# Patient Record
Sex: Male | Born: 1955 | Race: Black or African American | Hispanic: No | Marital: Married | Smoking: Never smoker
Health system: Southern US, Community
[De-identification: ages and names within clinical notes are randomized; demographics above are authoritative.]

## PROBLEM LIST (undated history)

## (undated) DIAGNOSIS — I639 Cerebral infarction, unspecified: Secondary | ICD-10-CM

## (undated) DIAGNOSIS — J45909 Unspecified asthma, uncomplicated: Secondary | ICD-10-CM

## (undated) HISTORY — PX: ACHILLES TENDON SURGERY: SHX542

---

## 2014-04-13 ENCOUNTER — Emergency Department (HOSPITAL_COMMUNITY)
Admission: EM | Admit: 2014-04-13 | Discharge: 2014-04-13 | Disposition: A | Discharge status: Home / Self Care | Payer: Self-pay

## 2014-04-13 ENCOUNTER — Encounter (HOSPITAL_COMMUNITY): Payer: Self-pay | Admitting: Emergency Medicine

## 2014-04-13 HISTORY — DX: Unspecified asthma, uncomplicated: J45.909

## 2014-04-13 NOTE — ED Notes (Signed)
Per EMS: pt stepped on rake and went through shoe into left foot/heel.

## 2018-01-31 ENCOUNTER — Encounter (HOSPITAL_COMMUNITY): Payer: Self-pay

## 2018-01-31 ENCOUNTER — Other Ambulatory Visit: Payer: Self-pay

## 2018-01-31 ENCOUNTER — Emergency Department (HOSPITAL_COMMUNITY): Payer: Managed Care, Other (non HMO)

## 2018-01-31 ENCOUNTER — Inpatient Hospital Stay (HOSPITAL_COMMUNITY)
Admission: EM | Admit: 2018-01-31 | Discharge: 2018-02-02 | DRG: 066 | Disposition: A | Discharge status: Home / Self Care | Payer: Managed Care, Other (non HMO) | Attending: Internal Medicine | Admitting: Internal Medicine

## 2018-01-31 DIAGNOSIS — Z91041 Radiographic dye allergy status: Secondary | ICD-10-CM

## 2018-01-31 DIAGNOSIS — I6340 Cerebral infarction due to embolism of unspecified cerebral artery: Secondary | ICD-10-CM

## 2018-01-31 DIAGNOSIS — Z79899 Other long term (current) drug therapy: Secondary | ICD-10-CM

## 2018-01-31 DIAGNOSIS — E785 Hyperlipidemia, unspecified: Secondary | ICD-10-CM | POA: Diagnosis present

## 2018-01-31 DIAGNOSIS — J4520 Mild intermittent asthma, uncomplicated: Secondary | ICD-10-CM

## 2018-01-31 DIAGNOSIS — R2990 Unspecified symptoms and signs involving the nervous system: Secondary | ICD-10-CM | POA: Diagnosis present

## 2018-01-31 DIAGNOSIS — I639 Cerebral infarction, unspecified: Principal | ICD-10-CM | POA: Diagnosis present

## 2018-01-31 DIAGNOSIS — Z880 Allergy status to penicillin: Secondary | ICD-10-CM

## 2018-01-31 DIAGNOSIS — R030 Elevated blood-pressure reading, without diagnosis of hypertension: Secondary | ICD-10-CM | POA: Diagnosis present

## 2018-01-31 DIAGNOSIS — Z7951 Long term (current) use of inhaled steroids: Secondary | ICD-10-CM

## 2018-01-31 DIAGNOSIS — R270 Ataxia, unspecified: Secondary | ICD-10-CM | POA: Diagnosis not present

## 2018-01-31 DIAGNOSIS — Z823 Family history of stroke: Secondary | ICD-10-CM

## 2018-01-31 DIAGNOSIS — I10 Essential (primary) hypertension: Secondary | ICD-10-CM | POA: Diagnosis present

## 2018-01-31 DIAGNOSIS — J45909 Unspecified asthma, uncomplicated: Secondary | ICD-10-CM | POA: Diagnosis present

## 2018-01-31 HISTORY — DX: Cerebral infarction, unspecified: I63.9

## 2018-01-31 LAB — COMPREHENSIVE METABOLIC PANEL
ALBUMIN: 4.4 g/dL (ref 3.5–5.0)
ALK PHOS: 75 U/L (ref 38–126)
ALT: 36 U/L (ref 0–44)
AST: 24 U/L (ref 15–41)
Anion gap: 9 (ref 5–15)
BILIRUBIN TOTAL: 0.7 mg/dL (ref 0.3–1.2)
BUN: 9 mg/dL (ref 8–23)
CALCIUM: 9.2 mg/dL (ref 8.9–10.3)
CO2: 23 mmol/L (ref 22–32)
CREATININE: 1.05 mg/dL (ref 0.61–1.24)
Chloride: 105 mmol/L (ref 98–111)
GFR calc Af Amer: >60 mL/min (ref >60)
GLUCOSE: 105 mg/dL — AB (ref 70–99)
POTASSIUM: 3.5 mmol/L (ref 3.5–5.1)
Sodium: 137 mmol/L (ref 135–145)
TOTAL PROTEIN: 7.3 g/dL (ref 6.5–8.1)

## 2018-01-31 LAB — DIFFERENTIAL
ABS IMMATURE GRANULOCYTES: 0 10*3/uL (ref 0.0–0.1)
Basophils Absolute: 0 10*3/uL (ref 0.0–0.1)
Basophils Relative: 1 %
EOS ABS: 0.1 10*3/uL (ref 0.0–0.7)
Eosinophils Relative: 1 %
IMMATURE GRANULOCYTES: 0 %
LYMPHS ABS: 1.9 10*3/uL (ref 0.7–4.0)
LYMPHS PCT: 30 %
MONOS PCT: 10 %
Monocytes Absolute: 0.6 10*3/uL (ref 0.1–1.0)
Neutro Abs: 3.6 10*3/uL (ref 1.7–7.7)
Neutrophils Relative %: 58 %

## 2018-01-31 LAB — I-STAT CHEM 8, ED
BUN: 11 mg/dL (ref 8–23)
CHLORIDE: 105 mmol/L (ref 98–111)
CREATININE: 0.9 mg/dL (ref 0.61–1.24)
Calcium, Ion: 1.16 mmol/L (ref 1.15–1.40)
GLUCOSE: 108 mg/dL — AB (ref 70–99)
HEMATOCRIT: 46 % (ref 39.0–52.0)
HEMOGLOBIN: 15.6 g/dL (ref 13.0–17.0)
POTASSIUM: 3.6 mmol/L (ref 3.5–5.1)
Sodium: 140 mmol/L (ref 135–145)
TCO2: 21 mmol/L — ABNORMAL LOW (ref 22–32)

## 2018-01-31 LAB — CBC
HEMATOCRIT: 46.9 % (ref 39.0–52.0)
Hemoglobin: 15.6 g/dL (ref 13.0–17.0)
MCH: 31.4 pg (ref 26.0–34.0)
MCHC: 33.3 g/dL (ref 30.0–36.0)
MCV: 94.4 fL (ref 78.0–100.0)
PLATELETS: 215 10*3/uL (ref 150–400)
RBC: 4.97 MIL/uL (ref 4.22–5.81)
RDW: 13.2 % (ref 11.5–15.5)
WBC: 6.3 10*3/uL (ref 4.0–10.5)

## 2018-01-31 LAB — PROTIME-INR
INR: 1.05
Prothrombin Time: 13.6 seconds (ref 11.4–15.2)

## 2018-01-31 LAB — CBG MONITORING, ED: Glucose-Capillary: 95 mg/dL (ref 70–99)

## 2018-01-31 LAB — APTT: aPTT: 32 seconds (ref 24–36)

## 2018-01-31 LAB — I-STAT TROPONIN, ED: TROPONIN I, POC: 0 ng/mL (ref 0.00–0.08)

## 2018-01-31 MED ORDER — SODIUM CHLORIDE 0.9 % IV BOLUS
500.0000 mL | Freq: Once | INTRAVENOUS | Status: AC
  Administered 2018-01-31: 500 mL via INTRAVENOUS

## 2018-01-31 MED ORDER — MECLIZINE HCL 25 MG PO TABS
25.0000 mg | ORAL_TABLET | Freq: Once | ORAL | Status: AC
  Administered 2018-01-31: 25 mg via ORAL
  Filled 2018-01-31: qty 1

## 2018-01-31 NOTE — H&P (Signed)
History and Physical    Wayne Richards NFA:[id number] DOB: 01/30/1956 DOA: 01/31/2018  Referring MD/NP/PA:   PCP: Patient, No Pcp Per   Patient coming from:  The patient is coming from home.  At baseline, pt is independent for most of ADL.   Chief Complaint: ataxia and strokelike symptoms  HPI: Wayne Richards is a 62 y.o. male with medical history significant of asthma, borderline hypertension, vertigo, who presents with ataxia strokelike symptoms.  Patient states that he had one episode of left hand numbness and tingling 01/25/2018, lasted about 15 minutes. He also had 3 episodes of dizziness on the same day difficulty walking. Since then, pt has been having intermittent dizziness, difficulty walking.The symptoms have worsened today, and become more persistent for the whole day.  Currently patient does not have unilateral weakness, numbness or tingling in extremities.  No vision change or hearing loss.  Patient denies any chest pain, shortness breath, cough, fever or chills.  No nausea, vomiting, diarrhea, abdominal pain, symptoms of UTI.  Patient states that he has chronic vertigo, but symptoms are usually short lasting for about 3 to 5 minutes, and have never been persistent.   ED Course: pt was found to have WBC 6.3, INR 1.05, electrolytes renal function okay, temperature normal, no tachycardia, oxygen saturation 96% on room air, elevated blood pressure 177/112, CT head is negative for acute intracranial abnormalities.  Patient is placed on telemetry bed for observation.  Neurology, Dr. Laurence Slate was consulted.  Review of Systems:   General: no fevers, chills, no body weight gain, has fatigue HEENT: no blurry vision, hearing changes or sore throat Respiratory: no dyspnea, coughing, wheezing CV: no chest pain, no palpitations GI: no nausea, vomiting, abdominal pain, diarrhea, constipation GU: no dysuria, burning on urination, increased urinary frequency, hematuria  Ext: no leg edema Neuro: has  left hand tingling, ataxia. No vision change or hearing loss Skin: no rash, no skin tear. MSK: No muscle spasm, no deformity, no limitation of range of movement in spin Heme: No easy bruising.  Travel history: No recent long distant travel.  Allergy:  Allergies  Allergen Reactions  . Iodine Hives, Itching, Swelling and Rash  . Peanut-Containing Drug Products Hives, Itching and Swelling  . Shellfish Allergy Hives, Itching and Swelling  . Penicillins Hives, Swelling and Rash    Past Medical History:  Diagnosis Date  . Asthma     Past Surgical History:  Procedure Laterality Date  . ACHILLES TENDON SURGERY      Social History:  reports that he has never smoked. He does not have any smokeless tobacco history on file. He reports that he does not drink alcohol or use drugs.  Family History:  Family History  Problem Relation Age of Onset  . Hypertension Father   . Cardiomyopathy Brother      Prior to Admission medications   Medication Sig Start Date End Date Taking? Authorizing Provider  fexofenadine-pseudoephedrine (ALLEGRA-D) 60-120 MG 12 hr tablet Take 1 tablet by mouth 2 (two) times daily as needed (allergies).   Yes [provider]  Ibuprofen-Famotidine (DUEXIS) 800-26.6 MG TABS Take 1 tablet by mouth every 12 (twelve) hours as needed (pain).   Yes [provider]  salmeterol (SEREVENT DISKUS) 50 MCG/DOSE diskus inhaler Inhale 1 puff into the lungs 2 (two) times daily.   Yes [provider]    Physical Exam: Vitals:   01/31/18 2159 01/31/18 2200 01/31/18 2345 02/01/18 0000  BP:  (!) 169/92  (!) 157/94  Pulse:  66 65 67  Resp:  12 14 11   Temp: 98 F (36.7 C)     TempSrc:      SpO2:  98% 96% 95%   General: Not in acute distress HEENT:       Eyes: PERRL, EOMI, no scleral icterus.       ENT: No discharge from the ears and nose, no pharynx injection, no tonsillar enlargement.        Neck: No JVD, no bruit, no mass felt. Heme: No neck lymph  node enlargement. Cardiac: S1/S2, RRR, No murmurs, No gallops or rubs. Respiratory: No rales, wheezing, rhonchi or rubs. GI: Soft, nondistended, nontender, no rebound pain, no organomegaly, BS present. GU: No hematuria Ext: No pitting leg edema bilaterally. 2+DP/PT pulse bilaterally. Musculoskeletal: No joint deformities, No joint redness or warmth, no limitation of ROM in spin. Skin: No rashes.  Neuro: Alert, oriented X3, cranial nerves II-XII grossly intact, moves all extremities normally. Muscle strength 5/5 in all extremities, sensation to light touch intact. Brachial reflex 2+ bilaterally. Has ataxia. Psych: Patient is not psychotic, no suicidal or hemocidal ideation.  Labs on Admission: I have personally reviewed following labs and imaging studies  CBC: Recent Labs  Lab 01/31/18 1440 01/31/18 1452  WBC 6.3  --   NEUTROABS 3.6  --   HGB 15.6 15.6  HCT 46.9 46.0  MCV 94.4  --   PLT 215  --    Basic Metabolic Panel: Recent Labs  Lab 01/31/18 1440 01/31/18 1452  NA 137 140  K 3.5 3.6  CL 105 105  CO2 23  --   GLUCOSE 105* 108*  BUN 9 11  CREATININE 1.05 0.90  CALCIUM 9.2  --    GFR: CrCl cannot be calculated (Unknown ideal weight.). Liver Function Tests: Recent Labs  Lab 01/31/18 1440  AST 24  ALT 36  ALKPHOS 75  BILITOT 0.7  PROT 7.3  ALBUMIN 4.4   No results for input(s): LIPASE, AMYLASE in the last 168 hours. No results for input(s): AMMONIA in the last 168 hours. Coagulation Profile: Recent Labs  Lab 01/31/18 1440  INR 1.05   Cardiac Enzymes: No results for input(s): CKTOTAL, CKMB, CKMBINDEX, TROPONINI in the last 168 hours. BNP (last 3 results) No results for input(s): PROBNP in the last 8760 hours. HbA1C: No results for input(s): HGBA1C in the last 72 hours. CBG: Recent Labs  Lab 01/31/18 2109  GLUCAP 95   Lipid Profile: No results for input(s): CHOL, HDL, LDLCALC, TRIG, CHOLHDL, LDLDIRECT in the last 72 hours. Thyroid Function  Tests: No results for input(s): TSH, T4TOTAL, FREET4, T3FREE, THYROIDAB in the last 72 hours. Anemia Panel: No results for input(s): VITAMINB12, FOLATE, FERRITIN, TIBC, IRON, RETICCTPCT in the last 72 hours. Urine analysis: No results found for: COLORURINE, APPEARANCEUR, LABSPEC, PHURINE, GLUCOSEU, HGBUR, BILIRUBINUR, KETONESUR, PROTEINUR, UROBILINOGEN, NITRITE, LEUKOCYTESUR Sepsis Labs: @LABRCNTIP (procalcitonin:4,lacticidven:4) )No results found for this or any previous visit (from the past 240 hour(s)).   Radiological Exams on Admission: Ct Head Wo Contrast  Result Date: 01/31/2018 CLINICAL DATA:  One-week history of intermittent dizziness. Mild headache. No reported injury. EXAM: CT HEAD WITHOUT CONTRAST TECHNIQUE: Contiguous axial images were obtained from the base of the skull through the vertex without intravenous contrast. COMPARISON:  None. FINDINGS: Brain: No evidence of parenchymal hemorrhage or extra-axial fluid collection. No mass lesion, mass effect, or midline shift. No CT evidence of acute infarction. Cerebral volume is age appropriate. No ventriculomegaly. Vascular: No acute abnormality. Skull: No evidence of calvarial  fracture. Sinuses/Orbits: No fluid levels. Scattered mild partial opacification of the dependent maxillary sinuses and ethmoidal air cells. Other:  The mastoid air cells are unopacified. IMPRESSION: 1.  No evidence of acute intracranial abnormality. 2. Mild paranasal sinusitis of uncertain chronicity, probably chronic. Electronically Signed   By: Delbert Phenix M.D.   On: 01/31/2018 16:51     EKG: Independently reviewed.  Sinus rhythm, QTC 407, Q waves in lead III, mild T wave inversion in V4-V6  Assessment/Plan Principal Problem:   Ataxia Active Problems:   Asthma   Elevated blood pressure reading   Stroke-like symptoms   Ataxia and stroke-like symptoms: Etiology is not clear.  Differential diagnosis include posterior circulation stroke and peripheral vertigo.   CT head is negative for acute intracranial abnormalities. I will get MRI first. If pt has stroke-->will do stroke work up. Neurology, Dr. Lyman Bishop was consulted.  - will place on tele bed for obs - will follow up Neurology's Recs.  - Obtain MRI - start ASA  - fasting lipid panel and HbA1c  - Check UDS  - PT/OT consult  Asthma: stable -prn albuterol nebs -Brovana nebs  Elevated blood pressure reading: pt has hx o borderline hypertension, not on medications. BNp 177/112. -prn IV hydralazine for blood pressure>220. If MRI is negative for stroke and Bp is persistently elevated, will start oral blood pressure medications, such as amlodipine   DVT ppx: SQ Lovenox Code Status: Full code Family Communication: None at bed side.       Disposition Plan:  Anticipate discharge back to previous home environment Consults called:  Neurology, Dr. Verita Lamb Admission status: Obs / tele    Date of Service 02/01/2018    Lorretta Harp [facility] Pager [phone]  If 7PM-7AM, please contact night-coverage [email or web address] Password [email or web address] 02/01/2018, 1:34 AM

## 2018-01-31 NOTE — ED Triage Notes (Signed)
Pt endorses confusion and difficulty with coordination while ambulating since 01/25/18 that was intermittent until last night. Has remained constant since. VSS, axox4. NO neuro deficits.

## 2018-01-31 NOTE — ED Provider Notes (Signed)
Emergency Department Provider Note   I have reviewed the triage vital signs and the nursing notes.   HISTORY  Chief Complaint Altered Mental Status   HPI Wayne Richards is a 62 y.o. male with PMH of asthma and borderline HTN presents to the emergency department for evaluation of intermittent feeling of being off balance and more persistent gait instability over the past 24 hours.  Patient states he has a history of vertigo but this feels very unlike his vertigo symptoms.  He states with his typical vertigo he has sudden, severe sensation of room spinning and nausea.  He states this is more like feeling generally off balance and uncoordinated.  His episodes over the past 2 weeks have lasted approximately 1 hour and then resolved.  Starting yesterday evening at approximately 10 PM he developed this off balance station and has persisted until today.  He denies any chest pain or dyspnea.  He is also feeling some left arm weakness with some tingling in the fingers on that side.  No left leg symptoms.  No voice changes, vision changes, difficulty swallowing.    Past Medical History:  Diagnosis Date  . Asthma     Patient Active Problem List   Diagnosis Date Noted  . Cerebral embolism with cerebral infarction 02/01/2018  . 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 Stroke-like symptoms   . Ataxia 01/31/2018  . Elevated blood pressure reading 01/31/2018  . Asthma     Past Surgical History:  Procedure Laterality Date  . ACHILLES TENDON SURGERY      Allergies Iodine; Peanut-containing drug products; Shellfish allergy; and Penicillins  Family History  Problem Relation Age of Onset  . Hypertension Father   . Cardiomyopathy Brother     Social History Social History   Tobacco Use  . Smoking status: Never Smoker  Substance Use Topics  . Alcohol use: Never    Frequency: Never  . Drug use: Never    Review of Systems  Constitutional: No fever/chills Eyes: No visual  changes. ENT: No sore throat. Cardiovascular: Denies chest pain. Respiratory: Denies shortness of breath. Gastrointestinal: No abdominal pain.  No nausea, no vomiting.  No diarrhea.  No constipation. Genitourinary: Negative for dysuria. Musculoskeletal: Negative for back pain. Skin: Negative for rash. Neurological: Negative for headaches. Positive gait instability.   10-point ROS otherwise negative.  ____________________________________________   PHYSICAL EXAM:  VITAL SIGNS: ED Triage Vitals  Enc Vitals Group     BP 01/31/18 1429 (!) 165/98     Pulse Rate 01/31/18 1429 62     Resp 01/31/18 1429 20     Temp 01/31/18 1429 (!) 97.5 F (36.4 C)     Temp Source 01/31/18 1429 Oral     SpO2 01/31/18 1429 98 %     Pain Score 01/31/18 1441 0   Constitutional: Alert and oriented. Well appearing and in no acute distress. Eyes: Conjunctivae are normal. PERRL. EOMI. Head: Atraumatic. Nose: No congestion/rhinnorhea. Mouth/Throat: Mucous membranes are moist.  Neck: No stridor. Cardiovascular: Normal rate, regular rhythm. Good peripheral circulation. Grossly normal heart sounds.   Respiratory: Normal respiratory effort.  No retractions. Lungs CTAB. Gastrointestinal: Soft and nontender. No distention.  Musculoskeletal: No lower extremity tenderness nor edema. No gross deformities of extremities. Neurologic:  Normal speech and language. Normal CN exam 2-12. Normal finger-to-nose with the right hand but slightly ataxic with the left. Very mild decreased grip strength on the left. Normal sensation in the upper and lower extremities. Normal heel-to-shin bilaterally. Some truncal  ataxia on standing from bed and slightly hesitant ataxic gait but does not require assistance with ambulation.  Skin:  Skin is warm, dry and intact. No rash noted.   ____________________________________________   LABS (all labs ordered are listed, but only abnormal results are displayed)  Labs Reviewed   COMPREHENSIVE METABOLIC PANEL - Abnormal; Notable for the following components:      Result Value   Glucose, Bld 105 (*)    All other components within normal limits  RAPID URINE DRUG SCREEN, HOSP PERFORMED - Abnormal; Notable for the following components:   Barbiturates   (*)    Value: Result not available. Reagent lot number recalled by manufacturer.   All other components within normal limits  HEMOGLOBIN A1C - Abnormal; Notable for the following components:   Hgb A1c MFr Bld 5.8 (*)    All other components within normal limits  LIPID PANEL - Abnormal; Notable for the following components:   Cholesterol 232 (*)    Triglycerides 233 (*)    HDL 35 (*)    VLDL 47 (*)    LDL Cholesterol 150 (*)    All other components within normal limits  I-STAT CHEM 8, ED - Abnormal; Notable for the following components:   Glucose, Bld 108 (*)    TCO2 21 (*)    All other components within normal limits  PROTIME-INR  APTT  CBC  DIFFERENTIAL  HIV ANTIBODY (ROUTINE TESTING)  I-STAT TROPONIN, ED  CBG MONITORING, ED   ____________________________________________  EKG   EKG Interpretation  Date/Time:  Monday January 31 2018 14:34:59 EDT Ventricular Rate:  67 PR Interval:  148 QRS Duration: 94 QT Interval:  386 QTC Calculation: 407 R Axis:   41 Text Interpretation:  Normal sinus rhythm with sinus arrhythmia Nonspecific T wave abnormality Abnormal ECG No STEMI.  Confirmed by Alona Bene [phone]) on 01/31/2018 11:24:50 PM       ____________________________________________  RADIOLOGY  Ct Head Wo Contrast  Result Date: 01/31/2018 CLINICAL DATA:  One-week history of intermittent dizziness. Mild headache. No reported injury. EXAM: CT HEAD WITHOUT CONTRAST TECHNIQUE: Contiguous axial images were obtained from the base of the skull through the vertex without intravenous contrast. COMPARISON:  None. FINDINGS: Brain: No evidence of parenchymal hemorrhage or extra-axial fluid collection. No mass  lesion, mass effect, or midline shift. No CT evidence of acute infarction. Cerebral volume is age appropriate. No ventriculomegaly. Vascular: No acute abnormality. Skull: No evidence of calvarial fracture. Sinuses/Orbits: No fluid levels. Scattered mild partial opacification of the dependent maxillary sinuses and ethmoidal air cells. Other:  The mastoid air cells are unopacified. IMPRESSION: 1.  No evidence of acute intracranial abnormality. 2. Mild paranasal sinusitis of uncertain chronicity, probably chronic. Electronically Signed   By: Delbert Phenix M.D.   On: 01/31/2018 16:51   Mr Brain Wo Contrast  Result Date: 02/01/2018 CLINICAL DATA:  Acute presentation with ataxia and stroke-like symptoms. Episode of left hand numbness and tingling. EXAM: MRI HEAD WITHOUT CONTRAST TECHNIQUE: Multiplanar, multiecho pulse sequences of the brain and surrounding structures were obtained without intravenous contrast. COMPARISON:  CT yesterday. FINDINGS: Brain: Diffusion imaging shows a 6 mm subacute infarction in the left side of the pons. There is a larger region likely more acute infarction in the right side of the pons. No focal cerebellar insult. Cerebral hemispheres show mild age related volume loss small vessel or large vessel infarction. There are dilated perivascular spaces. No sign of hemorrhage, mass, hydrocephalus or extra-axial collection Vascular: Anterior circulation vessels  appear do show flow. Posterior circulation vessels are quite diminutive. Due to their small size, cannot accurately evaluate presence or absence of vertebrobasilar flow disturbance. Skull and upper cervical spine: Negative Sinuses/Orbits: Few small sinus retention cysts. No significant sinus inflammatory disease. Orbits negative. Other: None IMPRESSION: Acute and subacute infarctions affecting the pons. 6 mm probably subacute infarction in the left side of the pons. Larger, more recent infarction in the right side of the pons, with fairly low  level diffusion disturbance at this time. No evidence of swelling or hemorrhage. Posterior circulation is quite diminutive. I cannot accurately assess for the potential of flow disturbance in the small posterior circulation vessels. Consider either CT angiography or MR angiography for better evaluation. Electronically Signed   By: Paulina Fusi M.D.   On: 02/01/2018 07:44    ____________________________________________   PROCEDURES  Procedure(s) performed:   Procedures  None ____________________________________________   INITIAL IMPRESSION / ASSESSMENT AND PLAN / ED COURSE  Pertinent labs & imaging results that were available during my care of the patient were reviewed by me and considered in my medical decision making (see chart for details).  Patient presents to the emergency department for evaluation of poor coordination, left arm weakness with some tingling at the fingers, and gait instability.  He has had intermittent symptoms over the last 2 weeks but symptoms have become constant and persistent since today at 10 PM.  The patient is outside of the window for TPA or other intervention.  He seems to have some very slight weakness in the left upper extremity.  His finger-to-nose testing with the left upper extremity is slightly slower and very mildly ataxic when compared to the right.  I have elevated suspicion for stroke.  Plan for MRI.  CT head reviewed with no acute findings.  Labs unremarkable.  EKG with nonspecific T wave changes but no old tracing for comparison.  I discussed the case with Dr. Lyman Bishop who will evaluate the patient in the emergency department.  MRI is pending but with persistent symptoms would benefit from further stroke work-up and PT/OT evaluation.   MRI with acute/subacute CVA. Plan for admit.   Discussed patient's case with Hospitalist, Dr. Clyde Lundborg to request admission. Patient and family (if present) updated with plan. Care transferred to hospitalist service.  I  reviewed all nursing notes, vitals, pertinent old records, EKGs, labs, imaging (as available).   ____________________________________________  FINAL CLINICAL IMPRESSION(S) / ED DIAGNOSES  Final diagnoses:  Cerebrovascular accident (CVA), unspecified mechanism (HCC)     MEDICATIONS GIVEN DURING THIS VISIT:  Medications  meclizine (ANTIVERT) tablet 25 mg (has no administration in time range)  albuterol (PROVENTIL) (2.5 MG/3ML) 0.083% nebulizer solution 2.5 mg (has no administration in time range)  0.9 %  sodium chloride infusion ( Intravenous Rate/Dose Verify 02/01/18 0600)  acetaminophen (TYLENOL) tablet 650 mg (has no administration in time range)    Or  acetaminophen (TYLENOL) solution 650 mg (has no administration in time range)  senna-docusate (Senokot-S) tablet 1 tablet (has no administration in time range)  enoxaparin (LOVENOX) injection 40 mg (has no administration in time range)  ondansetron (ZOFRAN) injection 4 mg (has no administration in time range)  hydrALAZINE (APRESOLINE) injection 5 mg (has no administration in time range)  zolpidem (AMBIEN) tablet 5 mg (has no administration in time range)  loratadine (CLARITIN) tablet 10 mg (has no administration in time range)  arformoterol (BROVANA) nebulizer solution 15 mcg (15 mcg Nebulization Not Given 02/01/18 0829)  famotidine (PEPCID) tablet 20  mg (has no administration in time range)  clopidogrel (PLAVIX) tablet 75 mg (has no administration in time range)  aspirin EC tablet 81 mg (has no administration in time range)  atorvastatin (LIPITOR) tablet 80 mg (has no administration in time range)  diphenhydrAMINE (BENADRYL) injection 50 mg (has no administration in time range)  meclizine (ANTIVERT) tablet 25 mg (25 mg Oral Given 01/31/18 2331)  sodium chloride 0.9 % bolus 500 mL (0 mLs Intravenous Stopped 02/01/18 0118)   stroke: mapping our early stages of recovery book ( Does not apply Given 02/01/18 0221)    Note:  This document was  prepared using Dragon voice recognition software and may include unintentional dictation errors.  Alona Bene, MD Emergency Medicine    Long, Arlyss Repress, MD 02/01/18 [phone]

## 2018-01-31 NOTE — ED Notes (Signed)
Pt back from x-ray.

## 2018-01-31 NOTE — ED Provider Notes (Signed)
Patient placed in Quick Look pathway, seen and evaluated   Chief Complaint: Dizziness, difficulty ambulating  HPI:   Patient presents with 1 week history of intermittent dizziness and "difficulty with coordination ".  States that symptoms would come on for a couple of hours at a time and then resolved but became constant since last night at around 10 PM.  He states that he feels "drunk or like I am on a boat ".  He states that he had difficulty putting on his pants this morning and felt unsteady standing on one leg.  Endorses intermittent dizziness but denies lightheadedness or syncope.  Has had a mild frontal headache.  Denies vision changes.  He initially had numbness of the left upper extremity with the initial episode 1 week ago but none since then.  Denies chest pain, shortness of breath, abdominal pain, nausea, vomiting, fevers.  ROS: Positive for dizziness, numbness, headache, difficulty ambulating Negative for syncope, lightheadedness, vision changes, chest pain, shortness of breath  Physical Exam:   Gen: No distress  Psych: Appropriate mood and affect  Skin: Warm    Focused Exam: Mental Status:  Alert, thought content appropriate, able to give a coherent history. Speech fluent without evidence of aphasia. Able to follow 2 step commands without difficulty.  Cranial Nerves:  II:  Peripheral visual fields grossly normal, pupils equal, round, reactive to light III,IV, VI: ptosis not present, extra-ocular motions intact bilaterally  V,VII: smile symmetric, facial light touch sensation equal VIII: hearing grossly normal to voice  X: uvula elevates symmetrically  XI: bilateral shoulder shrug symmetric and strong XII: midline tongue extension without fassiculations Motor:  Normal tone. 5/5 strength of BUE and BLE major muscle groups including strong and equal grip strength and dorsiflexion/plantar flexion Sensory: light touch normal in all extremities. Cerebellar: normal finger-to-nose  with bilateral upper extremities Gait: normal gait and balance. Able to walk on toes and heels with ease, but states he feels unsteady No pronator drift, Romberg sign absent, no nystagmus.     Initiation of care has begun. The patient has been counseled on the process, plan, and necessity for staying for the completion/evaluation, and the remainder of the medical screening examination    Bennye AlmFawze, Braun Rocca A, PA-C 01/31/18 1450    Azalia Bilisampos, Kevin, MD 02/01/18 1614

## 2018-02-01 ENCOUNTER — Other Ambulatory Visit: Payer: Self-pay

## 2018-02-01 ENCOUNTER — Observation Stay (HOSPITAL_COMMUNITY): Payer: Managed Care, Other (non HMO)

## 2018-02-01 ENCOUNTER — Inpatient Hospital Stay (HOSPITAL_COMMUNITY): Payer: Managed Care, Other (non HMO)

## 2018-02-01 ENCOUNTER — Encounter (HOSPITAL_COMMUNITY): Payer: Self-pay | Admitting: Internal Medicine

## 2018-02-01 DIAGNOSIS — I639 Cerebral infarction, unspecified: Secondary | ICD-10-CM | POA: Diagnosis not present

## 2018-02-01 DIAGNOSIS — J4520 Mild intermittent asthma, uncomplicated: Secondary | ICD-10-CM | POA: Diagnosis not present

## 2018-02-01 DIAGNOSIS — I6310 Cerebral infarction due to embolism of unspecified precerebral artery: Secondary | ICD-10-CM | POA: Diagnosis not present

## 2018-02-01 DIAGNOSIS — R270 Ataxia, unspecified: Secondary | ICD-10-CM | POA: Diagnosis not present

## 2018-02-01 DIAGNOSIS — R2990 Unspecified symptoms and signs involving the nervous system: Secondary | ICD-10-CM | POA: Diagnosis present

## 2018-02-01 DIAGNOSIS — I6789 Other cerebrovascular disease: Secondary | ICD-10-CM

## 2018-02-01 DIAGNOSIS — I6340 Cerebral infarction due to embolism of unspecified cerebral artery: Secondary | ICD-10-CM

## 2018-02-01 DIAGNOSIS — I10 Essential (primary) hypertension: Secondary | ICD-10-CM | POA: Diagnosis present

## 2018-02-01 DIAGNOSIS — Z880 Allergy status to penicillin: Secondary | ICD-10-CM | POA: Diagnosis not present

## 2018-02-01 DIAGNOSIS — Z91041 Radiographic dye allergy status: Secondary | ICD-10-CM | POA: Diagnosis not present

## 2018-02-01 DIAGNOSIS — Z79899 Other long term (current) drug therapy: Secondary | ICD-10-CM | POA: Diagnosis not present

## 2018-02-01 DIAGNOSIS — Z823 Family history of stroke: Secondary | ICD-10-CM | POA: Diagnosis not present

## 2018-02-01 DIAGNOSIS — Z7951 Long term (current) use of inhaled steroids: Secondary | ICD-10-CM | POA: Diagnosis not present

## 2018-02-01 DIAGNOSIS — E785 Hyperlipidemia, unspecified: Secondary | ICD-10-CM | POA: Diagnosis present

## 2018-02-01 DIAGNOSIS — J45909 Unspecified asthma, uncomplicated: Secondary | ICD-10-CM | POA: Diagnosis present

## 2018-02-01 LAB — LIPID PANEL
CHOL/HDL RATIO: 6.6 ratio
CHOLESTEROL: 232 mg/dL — AB (ref 0–200)
HDL: 35 mg/dL — AB (ref >40)
LDL Cholesterol: 150 mg/dL — ABNORMAL HIGH (ref 0–99)
Triglycerides: 233 mg/dL — ABNORMAL HIGH (ref <150)
VLDL: 47 mg/dL — ABNORMAL HIGH (ref 0–40)

## 2018-02-01 LAB — RAPID URINE DRUG SCREEN, HOSP PERFORMED
AMPHETAMINES: NOT DETECTED
BENZODIAZEPINES: NOT DETECTED
Cocaine: NOT DETECTED
Opiates: NOT DETECTED
Tetrahydrocannabinol: NOT DETECTED

## 2018-02-01 LAB — HEMOGLOBIN A1C
Hgb A1c MFr Bld: 5.8 % — ABNORMAL HIGH (ref 4.8–5.6)
Mean Plasma Glucose: 119.76 mg/dL

## 2018-02-01 LAB — ECHOCARDIOGRAM COMPLETE

## 2018-02-01 LAB — HIV ANTIBODY (ROUTINE TESTING W REFLEX): HIV SCREEN 4TH GENERATION: NONREACTIVE

## 2018-02-01 MED ORDER — CLOPIDOGREL BISULFATE 75 MG PO TABS
75.0000 mg | ORAL_TABLET | Freq: Every day | ORAL | Status: DC
  Administered 2018-02-01 – 2018-02-02 (×2): 75 mg via ORAL
  Filled 2018-02-01 (×2): qty 1

## 2018-02-01 MED ORDER — MECLIZINE HCL 12.5 MG PO TABS: 25.0000 mg | ORAL_TABLET | Freq: Three times a day (TID) | ORAL | Status: DC | PRN

## 2018-02-01 MED ORDER — METHYLPREDNISOLONE SODIUM SUCC 40 MG IJ SOLR: 40.0000 mg | Freq: Once | INTRAMUSCULAR | Status: DC

## 2018-02-01 MED ORDER — ARFORMOTEROL TARTRATE 15 MCG/2ML IN NEBU
15.0000 ug | INHALATION_SOLUTION | Freq: Two times a day (BID) | RESPIRATORY_TRACT | Status: DC
  Filled 2018-02-01 (×2): qty 2

## 2018-02-01 MED ORDER — ACETAMINOPHEN 325 MG PO TABS: 650.0000 mg | ORAL_TABLET | ORAL | Status: DC | PRN

## 2018-02-01 MED ORDER — ZOLPIDEM TARTRATE 5 MG PO TABS: 5.0000 mg | ORAL_TABLET | Freq: Every evening | ORAL | Status: DC | PRN

## 2018-02-01 MED ORDER — ACETAMINOPHEN 160 MG/5ML PO SOLN: 650.0000 mg | ORAL | Status: DC | PRN

## 2018-02-01 MED ORDER — STROKE: EARLY STAGES OF RECOVERY BOOK
Freq: Once | Status: AC
  Administered 2018-02-01: 02:00:00
  Filled 2018-02-01: qty 1

## 2018-02-01 MED ORDER — ALBUTEROL SULFATE (2.5 MG/3ML) 0.083% IN NEBU
2.5000 mg | INHALATION_SOLUTION | RESPIRATORY_TRACT | Status: DC | PRN
Start: 2018-02-01 — End: 2018-02-02

## 2018-02-01 MED ORDER — IBUPROFEN-FAMOTIDINE 800-26.6 MG PO TABS: 1.0000 | ORAL_TABLET | Freq: Two times a day (BID) | ORAL | Status: DC | PRN

## 2018-02-01 MED ORDER — IBUPROFEN 200 MG PO TABS: 800.0000 mg | ORAL_TABLET | Freq: Two times a day (BID) | ORAL | Status: DC | PRN

## 2018-02-01 MED ORDER — ASPIRIN 325 MG PO TABS
325.0000 mg | ORAL_TABLET | Freq: Every day | ORAL | Status: DC
  Filled 2018-02-01: qty 1

## 2018-02-01 MED ORDER — HYDRALAZINE HCL 20 MG/ML IJ SOLN: 5.0000 mg | INTRAMUSCULAR | Status: DC | PRN

## 2018-02-01 MED ORDER — DIPHENHYDRAMINE HCL 50 MG/ML IJ SOLN
50.0000 mg | Freq: Once | INTRAMUSCULAR | Status: DC
Start: 2018-02-01 — End: 2018-02-02
  Filled 2018-02-01: qty 1

## 2018-02-01 MED ORDER — LORATADINE 10 MG PO TABS
10.0000 mg | ORAL_TABLET | Freq: Every day | ORAL | Status: DC
  Administered 2018-02-01: 10 mg via ORAL
  Filled 2018-02-01 (×2): qty 1

## 2018-02-01 MED ORDER — ASPIRIN EC 81 MG PO TBEC
81.0000 mg | DELAYED_RELEASE_TABLET | Freq: Every day | ORAL | Status: DC
  Administered 2018-02-01 – 2018-02-02 (×2): 81 mg via ORAL
  Filled 2018-02-01 (×2): qty 1

## 2018-02-01 MED ORDER — ATORVASTATIN CALCIUM 80 MG PO TABS: 80.0000 mg | ORAL_TABLET | Freq: Every day | ORAL | Status: DC

## 2018-02-01 MED ORDER — SODIUM CHLORIDE 0.9 % IV SOLN
INTRAVENOUS | Status: DC
  Administered 2018-02-01 – 2018-02-02 (×2): via INTRAVENOUS

## 2018-02-01 MED ORDER — ASPIRIN 300 MG RE SUPP: 300.0000 mg | Freq: Every day | RECTAL | Status: DC

## 2018-02-01 MED ORDER — ONDANSETRON HCL 4 MG/2ML IJ SOLN: 4.0000 mg | Freq: Three times a day (TID) | INTRAMUSCULAR | Status: DC | PRN

## 2018-02-01 MED ORDER — FAMOTIDINE 20 MG PO TABS: 20.0000 mg | ORAL_TABLET | Freq: Two times a day (BID) | ORAL | Status: DC | PRN

## 2018-02-01 MED ORDER — ENOXAPARIN SODIUM 40 MG/0.4ML ~~LOC~~ SOLN
40.0000 mg | SUBCUTANEOUS | Status: DC
  Administered 2018-02-01 – 2018-02-02 (×2): 40 mg via SUBCUTANEOUS
  Filled 2018-02-01 (×3): qty 0.4

## 2018-02-01 MED ORDER — ATORVASTATIN CALCIUM 80 MG PO TABS
80.0000 mg | ORAL_TABLET | Freq: Every day | ORAL | Status: DC
  Administered 2018-02-01: 80 mg via ORAL
  Filled 2018-02-01: qty 1

## 2018-02-01 MED ORDER — ACETAMINOPHEN 650 MG RE SUPP: 650.0000 mg | RECTAL | Status: DC | PRN

## 2018-02-01 MED ORDER — SENNOSIDES-DOCUSATE SODIUM 8.6-50 MG PO TABS: 1.0000 | ORAL_TABLET | Freq: Every evening | ORAL | Status: DC | PRN

## 2018-02-01 MED ORDER — ACETAMINOPHEN 325 MG PO TABS: 650.0000 mg | ORAL_TABLET | Freq: Four times a day (QID) | ORAL | Status: DC | PRN

## 2018-02-01 NOTE — Evaluation (Signed)
Physical Therapy Evaluation Patient Details Name: Wayne Richards MRN: [id number] DOB: 11/03/1955 Today's Date: 02/01/2018   History of Present Illness  61 y.o. male past medical history of vertigo, hypertension and hyperlipidemia presents to the emergency room with about 1 week history of incoordination the left side.  Clinical Impression  Patient seen for mobility assessment. Mobilizing well but does show some higher level balance deficits and gait deviations. Patient would benefit from outpatient follow up. No further acute PT needs. Will sign off.     Follow Up Recommendations Outpatient PT    Equipment Recommendations  None recommended by PT    Recommendations for Other Services       Precautions / Restrictions        Mobility  Bed Mobility Overal bed mobility: Modified Independent             General bed mobility comments: HOB elevated, no assist required   Transfers Overall transfer level: Needs assistance   Transfers: Sit to/from Stand Sit to Stand: Supervision         General transfer comment: for safety upon initial standing   Ambulation/Gait Ambulation/Gait assistance: Supervision Gait Distance (Feet): 310 Feet Assistive device: None Gait Pattern/deviations: Step-through pattern;Decreased stride length;Drifts right/left;Narrow base of support Gait velocity: decreased   General Gait Details: patient with noted instability during higher level gait tasks but no overt LOB of need for physical assist at this time.   Stairs Stairs: Yes Stairs assistance: Supervision Stair Management: Alternating pattern;One rail Right Number of Stairs: 5 General stair comments: no physical assist, supervision for safety  Wheelchair Mobility    Modified Rankin (Stroke Patients Only) Modified Rankin (Stroke Patients Only) Pre-Morbid Rankin Score: No symptoms Modified Rankin: Moderate disability     Balance Overall balance assessment: Needs assistance   Sitting  balance-Leahy Scale: Good       Standing balance-Leahy Scale: Fair Standing balance comment: decreased righting reactions but no physical assist for static standing and functional task performance             High level balance activites: Side stepping;Backward walking;Direction changes;Turns;Sudden stops;Head turns High Level Balance Comments: noted gait deviations and decreased righting response when attempting higher level tasks but overall no physical assist required             Pertinent Vitals/Pain Pain Assessment: No/denies pain    Home Living Family/patient expects to be discharged to:: Private residence Living Arrangements: Spouse/significant other Available Help at Discharge: Family Type of Home: House Home Access: Stairs to enter Entrance Stairs-Rails: None Entrance Stairs-Number of Steps: 1 Home Layout: Two level;Bed/bath upstairs Home Equipment: None      Prior Function Level of Independence: Independent               Hand Dominance   Dominant Hand: Right    Extremity/Trunk Assessment   Upper Extremity Assessment Upper Extremity Assessment: LUE deficits/detail LUE Deficits / Details: grossly 4-/5 (L mildly weaker than R); mild discoordination noted with LUE LUE Coordination: decreased fine motor;decreased gross motor    Lower Extremity Assessment Lower Extremity Assessment: LLE deficits/detail LLE Deficits / Details: overall strength 4+/5 gross motions but functional coordination deficits noted LLE Coordination: decreased fine motor;decreased gross motor       Communication   Communication: No difficulties  Cognition Arousal/Alertness: Awake/alert Behavior During Therapy: WFL for tasks assessed/performed Overall Cognitive Status: Within Functional Limits for tasks assessed                                          General Comments      Exercises     Assessment/Plan    PT Assessment All further PT needs can be met  in the next venue of care  PT Problem List Decreased balance;Decreased mobility;Decreased coordination       PT Treatment Interventions      PT Goals (Current goals can be found in the Care Plan section)  Acute Rehab PT Goals Patient Stated Goal: to go home PT Goal Formulation: With patient Time For Goal Achievement: 02/15/18 Potential to Achieve Goals: Good    Frequency     Barriers to discharge        Co-evaluation               AM-PAC PT "6 Clicks" Daily Activity  Outcome Measure Difficulty turning over in bed (including adjusting bedclothes, sheets and blankets)?: None Difficulty moving from lying on back to sitting on the side of the bed? : None Difficulty sitting down on and standing up from a chair with arms (e.g., wheelchair, bedside commode, etc,.)?: None Help needed moving to and from a bed to chair (including a wheelchair)?: A Little Help needed walking in hospital room?: A Little Help needed climbing 3-5 steps with a railing? : A Little 6 Click Score: 21    End of Session   Activity Tolerance: Patient tolerated treatment well Patient left: (with OT therapist) Nurse Communication: Mobility status PT Visit Diagnosis: Other abnormalities of gait and mobility (R26.89);Difficulty in walking, not elsewhere classified (R26.2)    Time: 0957-1015 PT Time Calculation (min) (ACUTE ONLY): 18 min   Charges:   PT Evaluation $PT Eval Moderate Complexity: 1 Mod     PT G Codes:        Devon Werner, PT DPT  Board Certified Neurologic Specialist [phone]   Devon J Werner 02/01/2018, 10:23 AM   

## 2018-02-01 NOTE — Evaluation (Signed)
Occupational Therapy Evaluation Patient Details Name: Wayne Richards MRN: [id number] DOB: May 05, 1956 Today's Date: 02/01/2018    History of Present Illness 62 y.o. male past medical history of vertigo, hypertension and hyperlipidemia presents to the emergency room with about 1 week history of incoordination the left side. MRI revealed acute and subacute infarctions affecting the L pons and larger, more   Clinical Impression   This 62 y/o male presents with the above. At baseline pt is independent with ADLs, iADLs and functional mobility. Pt presenting with LUE fine motor/coordination deficits, min visual deficits in R periphery. Pt demonstrating room level functional mobility without AD and overall minguard-supervision this session. Pt demonstrates LB and standing grooming ADLs with supervision throughout. Pt will benefit from continued acute OT services and recommend follow up outpatient neuro OT services to further address pt's UE and visual deficits and to maximize his overall safety and independence with ADLs and functional mobility.     Follow Up Recommendations  Outpatient OT;Supervision - Intermittent(outpatient neuro )    Equipment Recommendations  None recommended by OT           Precautions / Restrictions Precautions Precautions: None Restrictions Weight Bearing Restrictions: No      Mobility Bed Mobility Overal bed mobility: Modified Independent             General bed mobility comments: HOB elevated, no assist required   Transfers Overall transfer level: Needs assistance   Transfers: Sit to/from Stand Sit to Stand: Supervision         General transfer comment: for safety upon initial standing     Balance Overall balance assessment: Needs assistance   Sitting balance-Leahy Scale: Good       Standing balance-Leahy Scale: Fair Standing balance comment: decreased righting reactions but no physical assist for static standing and functional task  performance             High level balance activites: Side stepping;Backward walking;Direction changes;Turns;Sudden stops;Head turns High Level Balance Comments: noted gait deviations and decreased righting response when attempting higher level tasks but overall no physical assist required           ADL either performed or assessed with clinical judgement   ADL Overall ADL's : Needs assistance/impaired Eating/Feeding: Independent;Sitting   Grooming: Supervision/safety;Standing;Oral care;Wash/dry face Grooming Details (indicate cue type and reason): supervision for static standing  Upper Body Bathing: Supervision/ safety;Sitting   Lower Body Bathing: Supervison/ safety;Sit to/from stand   Upper Body Dressing : Sitting;Modified independent   Lower Body Dressing: Supervision/safety;Sit to/from stand Lower Body Dressing Details (indicate cue type and reason): pt donning shorts while seated in bathroom; with distant supervision from therapist outside of bathroom door; educated pt to thread LEs into shorts prior to standing to advance over hips for increased safety due to balance deficits  Toilet Transfer: Supervision/safety;Min guard;Ambulation;Regular Toilet   Toileting- ArchitectClothing Manipulation and Hygiene: Supervision/safety;Sit to/from stand       Functional mobility during ADLs: Min guard;Supervision/safety General ADL Comments: pt with LUE coordination and fine motor deficits. pt pleasant with therapist but with frustrations due to current level of functioning compared to baseline      Vision Baseline Vision/History: Wears glasses(bifocals) Wears Glasses: At all times Patient Visual Report: No change from baseline Vision Assessment?: Yes Eye Alignment: Within Functional Limits Ocular Range of Motion: Within Functional Limits Alignment/Gaze Preference: Within Defined Limits Tracking/Visual Pursuits: Able to track stimulus in all quads without difficulty Saccades: Within  functional limits Visual Fields: Impaired-to be further  tested in functional context Additional Comments: R peripheral deficits noted with formal testing but do not appear to impact his ability to complete functional tasks this session, will further assess                Pertinent Vitals/Pain Pain Assessment: No/denies pain     Hand Dominance Right   Extremity/Trunk Assessment Upper Extremity Assessment Upper Extremity Assessment: LUE deficits/detail LUE Deficits / Details: grossly 4-/5 (L mildly weaker than R); mild discoordination noted with LUE LUE Coordination: decreased fine motor;decreased gross motor   Lower Extremity Assessment Lower Extremity Assessment: Defer to PT evaluation LLE Deficits / Details: overall strength 4+/5 gross motions but functional coordination deficits noted LLE Coordination: decreased fine motor;decreased gross motor       Communication Communication Communication: No difficulties   Cognition Arousal/Alertness: Awake/alert Behavior During Therapy: WFL for tasks assessed/performed Overall Cognitive Status: Within Functional Limits for tasks assessed                                                      Home Living Family/patient expects to be discharged to:: Private residence Living Arrangements: Spouse/significant other Available Help at Discharge: Family Type of Home: House Home Access: Stairs to enter Secretary/administrator of Steps: 1 Entrance Stairs-Rails: None Home Layout: Two level;Bed/bath upstairs Alternate Level Stairs-Number of Steps: flight   Bathroom Shower/Tub: Producer, television/film/video: Standard     Home Equipment: None          Prior Functioning/Environment Level of Independence: Independent                 OT Problem List: Decreased coordination;Impaired vision/perception;Impaired balance (sitting and/or standing)      OT Treatment/Interventions: Self-care/ADL training;DME  and/or AE instruction;Therapeutic activities;Balance training;Patient/family education;Therapeutic exercise    OT Goals(Current goals can be found in the care plan section) Acute Rehab OT Goals Patient Stated Goal: to go home OT Goal Formulation: With patient Time For Goal Achievement: 02/15/18 Potential to Achieve Goals: Good  OT Frequency: Min 2X/week   Barriers to D/C:            Co-evaluation              AM-PAC PT "6 Clicks" Daily Activity     Outcome Measure Help from another person eating meals?: None Help from another person taking care of personal grooming?: None Help from another person toileting, which includes using toliet, bedpan, or urinal?: None Help from another person bathing (including washing, rinsing, drying)?: A Little Help from another person to put on and taking off regular upper body clothing?: None Help from another person to put on and taking off regular lower body clothing?: A Little 6 Click Score: 22   End of Session Nurse Communication: Mobility status  Activity Tolerance: Patient tolerated treatment well Patient left: in bed;with call bell/phone within reach;with family/visitor present  OT Visit Diagnosis: Other symptoms and signs involving the nervous system (R29.898)                Time: 1010-1025 OT Time Calculation (min): 15 min Charges:  OT General Charges $OT Visit: 1 Visit OT Evaluation $OT Eval Moderate Complexity: 1 Mod G-Codes:     Marcy Siren, OT Pager [phone] 02/01/2018   Orlando Penner 02/01/2018, 11:12 AM

## 2018-02-01 NOTE — Progress Notes (Signed)
  Echocardiogram 2D Echocardiogram has been performed.  Wayne Richards G Coraleigh Sheeran 02/01/2018, 3:25 PM

## 2018-02-01 NOTE — Progress Notes (Signed)
Pt to be pre medicated for Iodine/shell fish allergy.  Per CT Tech there is an order set.  Call placed to primary team to notify to call CT for specific order set.  Awaiting orders and or return call.  [email or web address]

## 2018-02-01 NOTE — Progress Notes (Addendum)
STROKE TEAM PROGRESS NOTE  HPI ( Dr Aroor) Wayne Richards is an 62 y.o. male past medical history of vertigo, hypertension and hyperlipidemia presents to the emergency room with about 1 week history of incoordination the left side.  Patient states that about 1 week ago while he was golfing he bent down and suddenly felt unsteady for about 10 minutes as well as had some numbness over the left hand.  His symptoms resolved and returned for a few minutes while in the bathroom.  He subsequently has had multiple episodes where he feels unsteady mainly on his left side.  However the symptoms have persisted since yesterday night and he finally decided to come to the hospital.  CT head is unremarkable.  MRI brain is pending.  Date last known well: 01/25/18 TPA not administered as he is outside the window    INTERVAL HISTORY His wife is at the bedside.  He recounted HPI with wife and Dr. Pearlean Brownie. Initial (older infarct) happened 1 week ago and the newest one probably happened on Sunday. 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   Vitals:   02/01/18 0219 02/01/18 0356 02/01/18 0600 02/01/18 0918  BP: (!) 154/99 (!) 169/102 (!) 132/93 (!) 165/104  Pulse: 60   65  Resp: 18 19  16   Temp: 97.8 F (36.6 C) 97.9 F (36.6 C) 98.1 F (36.7 C) 98.4 F (36.9 C)  TempSrc: Oral Oral Oral Oral  SpO2: 97% 96% 98% 97%    CBC:  Recent Labs  Lab 01/31/18 1440 01/31/18 1452  WBC 6.3  --   NEUTROABS 3.6  --   HGB 15.6 15.6  HCT 46.9 46.0  MCV 94.4  --   PLT 215  --     Basic Metabolic Panel:  Recent Labs  Lab 01/31/18 1440 01/31/18 1452  NA 137 140  K 3.5 3.6  CL 105 105  CO2 23  --   GLUCOSE 105* 108*  BUN 9 11  CREATININE 1.05 0.90  CALCIUM 9.2  --    Lipid Panel:     Component Value Date/Time   CHOL 232 (H) 02/01/2018 0326   TRIG 233 (H) 02/01/2018 0326   HDL 35 (L) 02/01/2018 0326   CHOLHDL 6.6 02/01/2018 0326   VLDL 47 (H)  02/01/2018 0326   LDLCALC 150 (H) 02/01/2018 0326   HgbA1c:  Lab Results  Component Value Date   HGBA1C 5.8 (H) 02/01/2018   Urine Drug Screen:     Component Value Date/Time   LABOPIA NONE DETECTED 02/01/2018 0025   COCAINSCRNUR NONE DETECTED 02/01/2018 0025   LABBENZ NONE DETECTED 02/01/2018 0025   AMPHETMU NONE DETECTED 02/01/2018 0025   THCU NONE DETECTED 02/01/2018 0025   LABBARB (A) 02/01/2018 0025    Result not available. Reagent lot number recalled by manufacturer.    Alcohol Level No results found for: [facility]  IMAGING Ct Head Wo Contrast  Result Date: 01/31/2018 CLINICAL DATA:  One-week history of intermittent dizziness. Mild headache. No reported injury. EXAM: CT HEAD WITHOUT CONTRAST TECHNIQUE: Contiguous axial images were obtained from the base of the skull through the vertex without intravenous contrast. COMPARISON:  None. FINDINGS: Brain: No evidence of parenchymal hemorrhage or extra-axial fluid collection. No mass lesion, mass effect, or midline shift. No CT evidence of acute infarction. Cerebral volume is age appropriate. No ventriculomegaly. Vascular: No acute abnormality. Skull: No evidence of calvarial fracture. Sinuses/Orbits: No  fluid levels. Scattered mild partial opacification of the dependent maxillary sinuses and ethmoidal air cells. Other:  The mastoid air cells are unopacified. IMPRESSION: 1.  No evidence of acute intracranial abnormality. 2. Mild paranasal sinusitis of uncertain chronicity, probably chronic. Electronically Signed   By: Delbert Phenix M.D.   On: 01/31/2018 16:51   Mr Brain Wo Contrast  Result Date: 02/01/2018 CLINICAL DATA:  Acute presentation with ataxia and stroke-like symptoms. Episode of left hand numbness and tingling. EXAM: MRI HEAD WITHOUT CONTRAST TECHNIQUE: Multiplanar, multiecho pulse sequences of the brain and surrounding structures were obtained without intravenous contrast. COMPARISON:  CT yesterday. FINDINGS: Brain: Diffusion imaging  shows a 6 mm subacute infarction in the left side of the pons. There is a larger region likely more acute infarction in the right side of the pons. No focal cerebellar insult. Cerebral hemispheres show mild age related volume loss small vessel or large vessel infarction. There are dilated perivascular spaces. No sign of hemorrhage, mass, hydrocephalus or extra-axial collection Vascular: Anterior circulation vessels appear do show flow. Posterior circulation vessels are quite diminutive. Due to their small size, cannot accurately evaluate presence or absence of vertebrobasilar flow disturbance. Skull and upper cervical spine: Negative Sinuses/Orbits: Few small sinus retention cysts. No significant sinus inflammatory disease. Orbits negative. Other: None IMPRESSION: Acute and subacute infarctions affecting the pons. 6 mm probably subacute infarction in the left side of the pons. Larger, more recent infarction in the right side of the pons, with fairly low level diffusion disturbance at this time. No evidence of swelling or hemorrhage. Posterior circulation is quite diminutive. I cannot accurately assess for the potential of flow disturbance in the small posterior circulation vessels. Consider either CT angiography or MR angiography for better evaluation. Electronically Signed   By: Paulina Fusi M.D.   On: 02/01/2018 07:44    PHYSICAL EXAM  Pleasant obese middle-aged Caucasian male currently not in distress. . Afebrile. Head is nontraumatic. Neck is supple without bruit.    Cardiac exam no murmur or gallop. Lungs are clear to auscultation. Distal pulses are well felt. Neurological Exam ;  Awake  Alert oriented x 3. Normal speech and language.eye movements full without nystagmus but mild saccadic dysmetria bilaterally on horizontal gaze..fundi were not visualized. Vision acuity and fields appear normal. Hearing is normal. Palatal movements are normal. Face symmetric. Tongue midline. Normal strength, tone,  reflexes but diminished fine finger movements on the left. Mild impaired left finger-to-nose and knee to heel coordination.. Normal sensation. Gait deferred.  ASSESSMENT/PLAN Mr. Gawain Crombie is a 62 y.o. male with history of vertigo, HTNHLD presenting with 1 week incoordination of L side.   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 Workup underway  CT head No acute stroke.    MRI subacute L pontine infarct, acute R pontine infarct  CTA head & neck pending. Hx allergy with iodine. Will pre-treat.   2D Echo  pending   LDL 150  HgbA1c 5.8  Lovenox 40 mg sq daily for VTE prophylaxis  No antithrombotic prior to admission, now on aspirin 325 mg daily. Given TIA with ABCD2 score >4  mild stroke, will place on aspirin 81 mg and plavix 75 mg daily x 3 weeks, then aspirin alone. Orders adjusted.   Therapy recommendations:  pending   Disposition:  pending   Ok to fly for vacation in August  Hypertension  Within limited. Stable . Permissive hypertension (OK if < 220/120) but  gradually normalize in 5-7 days . Long-term BP goal normotensive  Hyperlipidemia  Home meds:  No statin listed  LDL 150, goal < 70  Add lipitor 80  Continue statin at discharge  Other Stroke Risk Factors  Family hx stroke (father)  Consider OP evaluation for Obstructive sleep apnea, will consider at time of neuro followup  Other Active Problems  asthma  Hospital day # 0  Annie MainSharon Biby, MSN, APRN, ANVP-BC, AGPCNP-BC Advanced Practice Stroke Nurse [facility] See Amion for Schedule & Pager information 02/01/2018 10:01 AM  I have personally examined this patient, reviewed notes, independently viewed imaging studies, participated in medical decision making and plan of care.ROS completed by me personally and pertinent positives fully documented  I have made any additions or clarifications directly to the above note. Agree  with note above.he presented with subacute symptoms of dizziness and gait ataxia due to bilateral pontine lacunar infarcts likely from small vessel disease. Recommend dual antiplatelet therapy for 3 weeks followed by aspirin alone and aggressive risk factor modification. Long discussion with the patient and wife regarding his stroke and answered questions. Discussed with Dr. Franki MonteAbroll. Greater than 50% time during this 35 minute visit was spent on counseling and coordination of care up was lacunar strokes and answering questions  Delia HeadyPramod Khalia Gong, MD Medical Director [facility] Pager: [phone] 02/01/2018 2:51 PM   To contact Stroke Continuity provider, please refer to [email or web address]. After hours, contact General Neurology

## 2018-02-01 NOTE — Progress Notes (Addendum)
[facility] PROGRESS NOTE  Nilda SimmerFrank Rebstock WGN:[id number] DOB: [birth year] DOA: 01/31/2018   PCP: Patient, No Pcp Per     Assessment/Plan: Principal Problem:   Ataxia Active Problems:   Asthma   Elevated blood pressure reading   Stroke-like symptoms   Cerebral embolism with cerebral infarction   Stroke (cerebrum) (HCC)  62 y.o. male with medical history significant of asthma, borderline hypertension, vertigo, who presents with ataxia strokelike symptoms.MRI confirm stroke  Assessment and plan Acute  [place] CVA # MRI of the brain without contrast acute and subacute infarctions in the pons.  # CTA head and neck is cancelled , iodine allergy ,ordered MRA  #Transthoracic Echo  pending # Patient started on aspirin and Plavix # Start or continue Atorvastatin 80 mg/other high intensity statin # BP goal: permissive HTN upto 220/110 mmHg # Telemetry monitoring # Frequent neuro checks # Total cholesterol 232, LDL 150, triglycerides 233, hemoglobin A1c 5.8 # PT recommends outpatient PT   Asthma: stable -prn albuterol nebs -Brovana nebs discontinue IV Solu-Medrol no active wheezing  Elevated blood pressure reading: pt has hx o borderline hypertension, not on medications.  -prn IV hydralazine for blood pressure>220. If MRI is negative for stroke and Bp is persistently elevated, will start oral blood pressure medications, such as amlodipine     DVT prophylaxsis  Lovenox  Code Status:  Full code   Family Communication: Discussed in detail with the patient, all imaging results, lab results explained to the patient   Disposition Plan:   Anticipate discharge tomorrow stroke workup is completed     Consultants:  neurology  Procedures:  none  Antibiotics: Anti-infectives (From admission, onward)   None         HPI/Subjective: Patient complains of left lower extremity weakness which is improving, denies any headache or blurry vision  Objective: Vitals:    02/01/18 0219 02/01/18 0356 02/01/18 0600 02/01/18 0918  BP: (!) 154/99 (!) 169/102 (!) 132/93 (!) 165/104  Pulse: 60   65  Resp: 18 19  16   Temp: 97.8 F (36.6 C) 97.9 F (36.6 C) 98.1 F (36.7 C) 98.4 F (36.9 C)  TempSrc: Oral Oral Oral Oral  SpO2: 97% 96% 98% 97%    Intake/Output Summary (Last 24 hours) at 02/01/2018 1002 Last data filed at 02/01/2018 0919 Gross per 24 hour  Intake 1084.42 ml  Output 450 ml  Net 634.42 ml    Exam:  Examination:  General exam: Appears calm and comfortable  Respiratory system: Clear to auscultation. Respiratory effort normal. Cardiovascular system: S1 & S2 heard, RRR. No JVD, murmurs, rubs, gallops or clicks. No pedal edema. Gastrointestinal system: Abdomen is nondistended, soft and nontender. No organomegaly or masses felt. Normal bowel sounds heard. Central nervous system: Alert and oriented. No focal neurological deficits. Extremities: Symmetric 5 x 5 power. Skin: No rashes, lesions or ulcers Psychiatry: Judgement and insight appear normal. Mood & affect appropriate.     Data Reviewed: I have personally reviewed following labs and imaging studies  Micro Results No results found for this or any previous visit (from the past 240 hour(s)).  Radiology Reports Ct Head Wo Contrast  Result Date: 01/31/2018 CLINICAL DATA:  One-week history of intermittent dizziness. Mild headache. No reported injury. EXAM: CT HEAD WITHOUT CONTRAST TECHNIQUE: Contiguous axial images were obtained from the base of the skull through the vertex without intravenous contrast. COMPARISON:  None. FINDINGS: Brain: No evidence of parenchymal hemorrhage or extra-axial fluid collection. No mass lesion,  mass effect, or midline shift. No CT evidence of acute infarction. Cerebral volume is age appropriate. No ventriculomegaly. Vascular: No acute abnormality. Skull: No evidence of calvarial fracture. Sinuses/Orbits: No fluid levels. Scattered mild partial opacification of the  dependent maxillary sinuses and ethmoidal air cells. Other:  The mastoid air cells are unopacified. IMPRESSION: 1.  No evidence of acute intracranial abnormality. 2. Mild paranasal sinusitis of uncertain chronicity, probably chronic. Electronically Signed   By: Delbert Phenix M.D.   On: 01/31/2018 16:51   Mr Brain Wo Contrast  Result Date: 02/01/2018 CLINICAL DATA:  Acute presentation with ataxia and stroke-like symptoms. Episode of left hand numbness and tingling. EXAM: MRI HEAD WITHOUT CONTRAST TECHNIQUE: Multiplanar, multiecho pulse sequences of the brain and surrounding structures were obtained without intravenous contrast. COMPARISON:  CT yesterday. FINDINGS: Brain: Diffusion imaging shows a 6 mm subacute infarction in the left side of the pons. There is a larger region likely more acute infarction in the right side of the pons. No focal cerebellar insult. Cerebral hemispheres show mild age related volume loss small vessel or large vessel infarction. There are dilated perivascular spaces. No sign of hemorrhage, mass, hydrocephalus or extra-axial collection Vascular: Anterior circulation vessels appear do show flow. Posterior circulation vessels are quite diminutive. Due to their small size, cannot accurately evaluate presence or absence of vertebrobasilar flow disturbance. Skull and upper cervical spine: Negative Sinuses/Orbits: Few small sinus retention cysts. No significant sinus inflammatory disease. Orbits negative. Other: None IMPRESSION: Acute and subacute infarctions affecting the pons. 6 mm probably subacute infarction in the left side of the pons. Larger, more recent infarction in the right side of the pons, with fairly low level diffusion disturbance at this time. No evidence of swelling or hemorrhage. Posterior circulation is quite diminutive. I cannot accurately assess for the potential of flow disturbance in the small posterior circulation vessels. Consider either CT angiography or MR angiography  for better evaluation. Electronically Signed   By: Paulina Fusi M.D.   On: 02/01/2018 07:44     CBC Recent Labs  Lab 01/31/18 1440 01/31/18 1452  WBC 6.3  --   HGB 15.6 15.6  HCT 46.9 46.0  PLT 215  --   MCV 94.4  --   MCH 31.4  --   MCHC 33.3  --   RDW 13.2  --   LYMPHSABS 1.9  --   MONOABS 0.6  --   EOSABS 0.1  --   BASOSABS 0.0  --     Chemistries  Recent Labs  Lab 01/31/18 1440 01/31/18 1452  NA 137 140  K 3.5 3.6  CL 105 105  CO2 23  --   GLUCOSE 105* 108*  BUN 9 11  CREATININE 1.05 0.90  CALCIUM 9.2  --   AST 24  --   ALT 36  --   ALKPHOS 75  --   BILITOT 0.7  --    ------------------------------------------------------------------------------------------------------------------ CrCl cannot be calculated (Unknown ideal weight.). ------------------------------------------------------------------------------------------------------------------ Recent Labs    02/01/18 0326  HGBA1C 5.8*   ------------------------------------------------------------------------------------------------------------------ Recent Labs    02/01/18 0326  CHOL 232*  HDL 35*  LDLCALC 150*  TRIG 233*  CHOLHDL 6.6   ------------------------------------------------------------------------------------------------------------------ No results for input(s): TSH, T4TOTAL, T3FREE, THYROIDAB in the last 72 hours.  Invalid input(s): FREET3 ------------------------------------------------------------------------------------------------------------------ No results for input(s): VITAMINB12, FOLATE, FERRITIN, TIBC, IRON, RETICCTPCT in the last 72 hours.  Coagulation profile Recent Labs  Lab 01/31/18 1440  INR 1.05    No results for input(s):  DDIMER in the last 72 hours.  Cardiac Enzymes No results for input(s): CKMB, TROPONINI, MYOGLOBIN in the last 168 hours.  Invalid input(s):  CK ------------------------------------------------------------------------------------------------------------------ Invalid input(s): POCBNP   CBG: Recent Labs  Lab 01/31/18 2109  GLUCAP 95       Studies: Ct Head Wo Contrast  Result Date: 01/31/2018 CLINICAL DATA:  One-week history of intermittent dizziness. Mild headache. No reported injury. EXAM: CT HEAD WITHOUT CONTRAST TECHNIQUE: Contiguous axial images were obtained from the base of the skull through the vertex without intravenous contrast. COMPARISON:  None. FINDINGS: Brain: No evidence of parenchymal hemorrhage or extra-axial fluid collection. No mass lesion, mass effect, or midline shift. No CT evidence of acute infarction. Cerebral volume is age appropriate. No ventriculomegaly. Vascular: No acute abnormality. Skull: No evidence of calvarial fracture. Sinuses/Orbits: No fluid levels. Scattered mild partial opacification of the dependent maxillary sinuses and ethmoidal air cells. Other:  The mastoid air cells are unopacified. IMPRESSION: 1.  No evidence of acute intracranial abnormality. 2. Mild paranasal sinusitis of uncertain chronicity, probably chronic. Electronically Signed   By: Delbert Phenix M.D.   On: 01/31/2018 16:51   Mr Brain Wo Contrast  Result Date: 02/01/2018 CLINICAL DATA:  Acute presentation with ataxia and stroke-like symptoms. Episode of left hand numbness and tingling. EXAM: MRI HEAD WITHOUT CONTRAST TECHNIQUE: Multiplanar, multiecho pulse sequences of the brain and surrounding structures were obtained without intravenous contrast. COMPARISON:  CT yesterday. FINDINGS: Brain: Diffusion imaging shows a 6 mm subacute infarction in the left side of the pons. There is a larger region likely more acute infarction in the right side of the pons. No focal cerebellar insult. Cerebral hemispheres show mild age related volume loss small vessel or large vessel infarction. There are dilated perivascular spaces. No sign of  hemorrhage, mass, hydrocephalus or extra-axial collection Vascular: Anterior circulation vessels appear do show flow. Posterior circulation vessels are quite diminutive. Due to their small size, cannot accurately evaluate presence or absence of vertebrobasilar flow disturbance. Skull and upper cervical spine: Negative Sinuses/Orbits: Few small sinus retention cysts. No significant sinus inflammatory disease. Orbits negative. Other: None IMPRESSION: Acute and subacute infarctions affecting the pons. 6 mm probably subacute infarction in the left side of the pons. Larger, more recent infarction in the right side of the pons, with fairly low level diffusion disturbance at this time. No evidence of swelling or hemorrhage. Posterior circulation is quite diminutive. I cannot accurately assess for the potential of flow disturbance in the small posterior circulation vessels. Consider either CT angiography or MR angiography for better evaluation. Electronically Signed   By: Paulina Fusi M.D.   On: 02/01/2018 07:44      Lab Results  Component Value Date   HGBA1C 5.8 (H) 02/01/2018   Lab Results  Component Value Date   LDLCALC 150 (H) 02/01/2018   CREATININE 0.90 01/31/2018       Scheduled Meds: . arformoterol  15 mcg Nebulization BID  . aspirin EC  81 mg Oral Daily  . clopidogrel  75 mg Oral Daily  . enoxaparin (LOVENOX) injection  40 mg Subcutaneous Q24H  . loratadine  10 mg Oral Daily   Continuous Infusions: . sodium chloride 125 mL/hr at 02/01/18 0600     LOS: 0 days    Time spent: >30 MINS    Richarda Overlie  [facility] Pager [phone]. If 7PM-7AM, please contact night-coverage at [email or web address], password [facility] 02/01/2018, 10:02 AM  LOS: 0 days

## 2018-02-01 NOTE — Consult Note (Signed)
Requesting Physician: Dr. Jacqulyn Bath    Chief Complaint: Gait instability  History obtained from: Patient and Chart    HPI:                                                                                                                                       Wayne Richards is an 62 y.o. male past medical history of vertigo, hypertension and hyperlipidemia presents to the emergency room with about 1 week history of incoordination the left side.  Patient states that about 1 week ago while he was golfing he bent down and suddenly felt unsteady for about 10 minutes as well as had some numbness over the left hand.  His symptoms resolved and returned for a few minutes while in the bathroom.  He subsequently has had multiple episodes where he feels unsteady mainly on his left side.  However the symptoms have persisted since yesterday night and he finally decided to come to the hospital.  CT head is unremarkable.  MRI brain is pending.  Date last known well: 01/25/18 TPA not administered as he is outside the window   Past Medical History:  Diagnosis Date  . Asthma     Past Surgical History:  Procedure Laterality Date  . ACHILLES TENDON SURGERY      Family History  Problem Relation Age of Onset  . Hypertension Father   . Cardiomyopathy Brother   Father had a stroke in his 51s  Social History:  reports that he has never smoked. He does not have any smokeless tobacco history on file. He reports that he does not drink alcohol or use drugs.  Allergies:  Allergies  Allergen Reactions  . Iodine Hives, Itching, Swelling and Rash  . Peanut-Containing Drug Products Hives, Itching and Swelling  . Shellfish Allergy Hives, Itching and Swelling  . Penicillins Hives, Swelling and Rash    Medications:                                                                                                                        I reviewed home medications  ROS:  14 systems reviewed and negative except above   Examination:                                                                                                      General: Appears well-developed and well-nourished.  Psych: Affect appropriate to situation Eyes: No scleral injection HENT: No OP obstrucion Head: Normocephalic.  Cardiovascular: Normal rate and regular rhythm.  Respiratory: Effort normal and breath sounds normal to anterior ascultation GI: Soft.  No distension. There is no tenderness.  Skin: WDI    Neurological Examination Mental Status: Alert, oriented, thought content appropriate.  Speech fluent without evidence of aphasia. Able to follow 3 step commands without difficulty. Cranial Nerves: II: Visual fields grossly normal,  III,IV, VI: ptosis not present, extra-ocular motions intact bilaterally, pupils equal, round, reactive to light and accommodation V,VII: smile symmetric, facial light touch sensation normal bilaterally VIII: hearing normal bilaterally IX,X: uvula rises symmetrically XI: bilateral shoulder shrug XII: midline tongue extension Motor: Right : Upper extremity   5/5    Left:     Upper extremity   5/5  Lower extremity   5/5     Lower extremity   5/5 Tone and bulk:normal tone throughout; no atrophy noted Sensory: Pinprick and light touch intact throughout, bilaterally Deep Tendon Reflexes: 2+ and symmetric throughout Plantars: Right: downgoing   Left: downgoing Cerebellar: Mild dysmetria to finger-nose on the left side, heel-to-shin also worse on the left side Gait: normal gait and station     Lab Results: Basic Metabolic Panel: Recent Labs  Lab 01/31/18 1440 01/31/18 1452  NA 137 140  K 3.5 3.6  CL 105 105  CO2 23  --   GLUCOSE 105* 108*  BUN 9 11  CREATININE 1.05 0.90  CALCIUM 9.2  --     CBC: Recent Labs  Lab 01/31/18 1440 01/31/18 1452  WBC 6.3  --    NEUTROABS 3.6  --   HGB 15.6 15.6  HCT 46.9 46.0  MCV 94.4  --   PLT 215  --     Coagulation Studies: Recent Labs    01/31/18 1440  LABPROT 13.6  INR 1.05    Imaging: Ct Head Wo Contrast  Result Date: 01/31/2018 CLINICAL DATA:  One-week history of intermittent dizziness. Mild headache. No reported injury. EXAM: CT HEAD WITHOUT CONTRAST TECHNIQUE: Contiguous axial images were obtained from the base of the skull through the vertex without intravenous contrast. COMPARISON:  None. FINDINGS: Brain: No evidence of parenchymal hemorrhage or extra-axial fluid collection. No mass lesion, mass effect, or midline shift. No CT evidence of acute infarction. Cerebral volume is age appropriate. No ventriculomegaly. Vascular: No acute abnormality. Skull: No evidence of calvarial fracture. Sinuses/Orbits: No fluid levels. Scattered mild partial opacification of the dependent maxillary sinuses and ethmoidal air cells. Other:  The mastoid air cells are unopacified. IMPRESSION: 1.  No evidence of acute intracranial abnormality. 2. Mild paranasal sinusitis of uncertain chronicity, probably chronic. Electronically Signed   By: Delbert Phenix M.D.   On: 01/31/2018 16:51     ASSESSMENT AND PLAN  62 year old male with vascular risk factors of hypertension hyperlipidemia which is being managed with lifestyle changes and not on any medication.  He does have a history of vertigo, however states these episodes are different and not provoked by turning his head.  It has persisted since yesterday night and patient is concerned that he is having a stroke.  Given risk factors, is very much possible and a CT scan can miss a small lacunar stroke.  MRI brain is pending.  Possible stroke  Recommend # MRI of the brain without contrast #MRA Head and neck  #Transthoracic Echo  # Start patient on ASA 325mg  daily #Start or continue Atorvastatin 80 mg/other high intensity statin # BP goal: permissive HTN upto 220/110 mmHg #  Telemetry monitoring # Frequent neuro checks # NPO until passes stroke swallow screen  Please page stroke NP  Or  PA  Or MD from 8am -4 pm  as this patient from this time will be  followed by the stroke.   You can look them up on [email or web address]  Password [email or web address]   Sushanth Aroor [facility] Pager Number [phone] Possible stroke - examined pt and has mild dysmetria on left  MRI pending

## 2018-02-01 NOTE — Progress Notes (Signed)
  Speech Language Pathology   Patient Details Name: Wayne SimmerFrank Shamoon MRN: [id number] DOB: 13-Nov-1955 Today's Date: 02/01/2018 Time: [id number]-        Pt without complaints with speech-cognition or swallowing during previous or past episodes. MRI (+) for pons infarct. Speech intelligible, no oral-motor deficits and cognitive function appears functional. No formal assessment needed at this time and pt in agreement. He denied dysphagia.    Breck CoonsLisa Willis CasseltonLitaker M.Ed [employer] Pager [phone]

## 2018-02-02 MED ORDER — ASPIRIN 81 MG PO TBEC: 81.0000 mg | DELAYED_RELEASE_TABLET | Freq: Every day | ORAL | 1 refills | Status: AC

## 2018-02-02 MED ORDER — CLOPIDOGREL BISULFATE 75 MG PO TABS: 75.0000 mg | ORAL_TABLET | Freq: Every day | ORAL | 2 refills | Status: AC

## 2018-02-02 MED ORDER — ATORVASTATIN CALCIUM 80 MG PO TABS: 80.0000 mg | ORAL_TABLET | Freq: Every day | ORAL | 2 refills | Status: DC

## 2018-02-02 MED ORDER — FAMOTIDINE 20 MG PO TABS: 20.0000 mg | ORAL_TABLET | Freq: Two times a day (BID) | ORAL | 0 refills | Status: AC | PRN

## 2018-02-02 MED ORDER — CLOPIDOGREL BISULFATE 75 MG PO TABS: 75.0000 mg | ORAL_TABLET | Freq: Every day | ORAL | 0 refills | Status: DC

## 2018-02-02 NOTE — Progress Notes (Signed)
Occupational Therapy Treatment Patient Details Name: Wayne SimmerFrank Richards MRN: [id number] DOB: Mar 22, 1956 Today's Date: 02/02/2018    History of present illness 62 y.o. male past medical history of vertigo, hypertension and hyperlipidemia presents to the emergency room with about 1 week history of incoordination the left side. MRI revealed acute and subacute infarctions affecting the L pons and larger, more   OT comments  Pt progressing towards OT goals, is tearful at times during session regarding current level of function but willing to work and progress with therapy. Pt requiring close minguard assist for room level functional mobility this session, appearing slightly unsteady during mobility and standing functional tasks but with no overt LOB. Pt completing standing grooming ADLs with minguard assist. Issued and reviewed LUE coordination/fine motor HEP with pt return demonstrating and verbalizing understanding throughout. Feel POC remains appropriate at this time. Will continue to follow acutely to progress pt towards established OT goals.   Follow Up Recommendations  Outpatient OT;Supervision - Intermittent(outpatient neuro )    Equipment Recommendations  None recommended by OT          Precautions / Restrictions Precautions Precautions: Fall Restrictions Weight Bearing Restrictions: No       Mobility Bed Mobility Overal bed mobility: Modified Independent             General bed mobility comments: HOB elevated, no assist required   Transfers Overall transfer level: Needs assistance   Transfers: Sit to/from Stand Sit to Stand: Supervision         General transfer comment: for safety upon initial standing     Balance Overall balance assessment: Needs assistance   Sitting balance-Leahy Scale: Good       Standing balance-Leahy Scale: Fair Standing balance comment: minguard for static balance as pt slightly unsteady during functional tasks without UE support                            ADL either performed or assessed with clinical judgement   ADL Overall ADL's : Needs assistance/impaired     Grooming: Min guard;Oral care;Standing Grooming Details (indicate cue type and reason): minguard for static standing as pt appearing slightly unsteady without UE support this session              Lower Body Dressing: Set up;Sit to/from stand Lower Body Dressing Details (indicate cue type and reason): setup for slip on shoes; educated to wear tennis shoes or closed toe shoes initially at home with longer distance mobility for increased safety as pt with flip flops this session              Functional mobility during ADLs: Min guard General ADL Comments: pt slightly unsteady with room level mobility requiring minguard assist; issued and reviewed LUE fine motor HEP; pt emotional during session regarding current level of function                        Cognition Arousal/Alertness: Awake/alert Behavior During Therapy: WFL for tasks assessed/performed Overall Cognitive Status: Within Functional Limits for tasks assessed                                          Exercises General Exercises - Upper Extremity Digit Composite Flexion: AROM;10 reps;Left;Seated Composite Extension: AROM;10 reps;Left;Seated Hand Exercises Digit Composite Abduction: AROM;10 reps;Left;Seated Digit Composite Adduction:  AROM;10 reps;Left;Seated Digit Lifts: AROM;10 reps;Left;Seated Opposition: AROM;10 reps;Left;Seated Hand Activities Stack Objects: Left;Seated Open and Close Containers: Left;Seated Other Exercises Other Exercises: issued red theraputty and corresponding handout, issued fine motor HEP and review both handouts with pt verbalizing and return demonstrating understanding                 Pertinent Vitals/ Pain       Pain Assessment: No/denies pain                                            Prior  Functioning/Environment              Frequency  Min 2X/week        Progress Toward Goals  OT Goals(current goals can now be found in the care plan section)  Progress towards OT goals: Progressing toward goals  Acute Rehab OT Goals Patient Stated Goal: to go home OT Goal Formulation: With patient Time For Goal Achievement: 02/15/18 Potential to Achieve Goals: Good  Plan Discharge plan remains appropriate    Co-evaluation                 AM-PAC PT "6 Clicks" Daily Activity     Outcome Measure   Help from another person eating meals?: None Help from another person taking care of personal grooming?: None Help from another person toileting, which includes using toliet, bedpan, or urinal?: None Help from another person bathing (including washing, rinsing, drying)?: None Help from another person to put on and taking off regular upper body clothing?: None Help from another person to put on and taking off regular lower body clothing?: A Little 6 Click Score: 23    End of Session    OT Visit Diagnosis: Other symptoms and signs involving the nervous system (R29.898)   Activity Tolerance Patient tolerated treatment well   Patient Left with call bell/phone within reach;with family/visitor present;Other (comment)(sitting EOB )   Nurse Communication Mobility status        Time: 1610-9604 OT Time Calculation (min): 25 min  Charges: OT General Charges $OT Visit: 1 Visit OT Treatments $Self Care/Home Management : 8-22 mins $Therapeutic Activity: 8-22 mins  Marcy Siren, OT Pager [phone] 02/02/2018    Orlando Penner 02/02/2018, 10:14 AM

## 2018-02-02 NOTE — Care Management Note (Addendum)
Case Management Note  Patient Details  Name: Nilda SimmerFrank Maloof MRN: [id number] Date of Birth: 01/26/1956  Subjective/Objective:      Pt admitted with CVA. He is from home with spouse.               Action/Plan: CM consulted for outpatient therapy. Pt interested in [facility]. Orders in Epic and information on the AVS. CM following for further d/c needs.   Addendum: pt discharging home with self care and his spouse. Wife to provide transportation home.   Expected Discharge Date:                  Expected Discharge Plan:  OP Rehab  In-House Referral:     Discharge planning Services  CM Consult  Post Acute Care Choice:    Choice offered to:     DME Arranged:    DME Agency:     HH Arranged:    HH Agency:     Status of Service:  Completed, signed off  If discussed at [employer] Length of Stay Meetings, dates discussed:    Additional Comments:  Kermit BaloKelli F Ayrabella Labombard, RN 02/02/2018, 8:12 AM

## 2018-02-02 NOTE — Discharge Summary (Addendum)
Physician Discharge Summary  Wayne Richards MRN: [id number] DOB/AGE: 1956/02/11 62 y.o.  PCP: Alroy Dust, L.Marlou Sa, MD   Admit date: 01/31/2018 Discharge date: 02/02/2018  Discharge Diagnoses:    Principal Problem:   Ataxia Active Problems:   Asthma   Elevated blood pressure reading   Stroke-like symptoms   Cerebral embolism with cerebral infarction   Stroke (cerebrum) [facility])    Follow-up recommendations Follow-up with PCP in 3-5 days , including all  additional recommended appointments as below Follow-up CBC, CMP in 3-5 days Recommend repeat lipid panel in 6 weeks     Allergies as of 02/02/2018      Reactions   Iodine Hives, Itching, Swelling, Rash   Peanut-containing Drug Products Hives, Itching, Swelling   Shellfish Allergy Hives, Itching, Swelling   Penicillins Hives, Swelling, Rash      Medication List    STOP taking these medications   DUEXIS 800-26.6 MG Tabs Generic drug:  Ibuprofen-Famotidine     TAKE these medications   aspirin 81 MG EC tablet Take 1 tablet (81 mg total) by mouth daily.   atorvastatin 80 MG tablet Commonly known as:  LIPITOR Take 1 tablet (80 mg total) by mouth daily at 6 PM.   clopidogrel 75 MG tablet Commonly known as:  PLAVIX Take 1 tablet (75 mg total) by mouth daily.   famotidine 20 MG tablet Commonly known as:  PEPCID Take 1 tablet (20 mg total) by mouth 2 (two) times daily as needed (with Ibuprofen).   fexofenadine-pseudoephedrine 60-120 MG 12 hr tablet Commonly known as:  ALLEGRA-D Take 1 tablet by mouth 2 (two) times daily as needed (allergies).   SEREVENT DISKUS 50 MCG/DOSE diskus inhaler Generic drug:  salmeterol Inhale 1 puff into the lungs 2 (two) times daily.         Discharge Condition:  stable  Discharge Instructions Get Medicines reviewed and adjusted: Please take all your medications with you for your next visit with your Primary MD  Please request your Primary MD to go over all hospital tests and  procedure/radiological results at the follow up, please ask your Primary MD to get all Hospital records sent to his/her office.  If you experience worsening of your admission symptoms, develop shortness of breath, life threatening emergency, suicidal or homicidal thoughts you must seek medical attention immediately by calling 911 or calling your MD immediately  if symptoms less severe.  You must read complete instructions/literature along with all the possible adverse reactions/side effects for all the Medicines you take and that have been prescribed to you. Take any new Medicines after you have completely understood and accpet all the possible adverse reactions/side effects.   Do not drive when taking Pain medications.   Do not take more than prescribed Pain, Sleep and Anxiety Medications  Special Instructions: If you have smoked or chewed Tobacco  in the last 2 yrs please stop smoking, stop any regular Alcohol  and or any Recreational drug use.  Wear Seat belts while driving.  Please note  You were cared for by a hospitalist during your hospital stay. Once you are discharged, your primary care physician will handle any further medical issues. Please note that NO REFILLS for any discharge medications will be authorized once you are discharged, as it is imperative that you return to your primary care physician (or establish a relationship with a primary care physician if you do not have one) for your aftercare needs so that they can reassess your need for medications and monitor your  lab values.  Discharge Instructions    Ambulatory referral to Occupational Therapy   Complete by:  As directed    Ambulatory referral to Physical Therapy   Complete by:  As directed        Allergies  Allergen Reactions  . Iodine Hives, Itching, Swelling and Rash  . Peanut-Containing Drug Products Hives, Itching and Swelling  . Shellfish Allergy Hives, Itching and Swelling  . Penicillins Hives, Swelling and  Rash      Disposition: Discharge disposition: 01-Home or Self Care        Consults:  Neurology    Significant Diagnostic Studies:  Ct Head Wo Contrast  Result Date: 01/31/2018 CLINICAL DATA:  One-week history of intermittent dizziness. Mild headache. No reported injury. EXAM: CT HEAD WITHOUT CONTRAST TECHNIQUE: Contiguous axial images were obtained from the base of the skull through the vertex without intravenous contrast. COMPARISON:  None. FINDINGS: Brain: No evidence of parenchymal hemorrhage or extra-axial fluid collection. No mass lesion, mass effect, or midline shift. No CT evidence of acute infarction. Cerebral volume is age appropriate. No ventriculomegaly. Vascular: No acute abnormality. Skull: No evidence of calvarial fracture. Sinuses/Orbits: No fluid levels. Scattered mild partial opacification of the dependent maxillary sinuses and ethmoidal air cells. Other:  The mastoid air cells are unopacified. IMPRESSION: 1.  No evidence of acute intracranial abnormality. 2. Mild paranasal sinusitis of uncertain chronicity, probably chronic. Electronically Signed   By: Ilona Sorrel M.D.   On: 01/31/2018 16:51   Mr Jodene Nam Head Wo Contrast  Result Date: 02/01/2018 CLINICAL DATA:  62 y/o  M; evaluation of stroke. EXAM: MRA NECK WITHOUT CONTRAST MRA HEAD WITHOUT CONTRAST TECHNIQUE: Multiplanar and multiecho pulse sequences of the neck were obtained without intravenous contrast. Angiographic images of the neck were obtained using MRA technique without and with intravenous contast.; Angiographic images of the Circle of Willis were obtained using MRA technique without intravenous contrast. COMPARISON:  02/01/2018 MRI of the head. FINDINGS: MRA NECK FINDINGS Aortic arch: Patent. Right common carotid artery: Patent. Right internal carotid artery: Patent. Right vertebral artery: Patent. Left common carotid artery: Patent. Left Internal carotid artery: Patent. Left Vertebral artery: Patent. There is no  evidence of hemodynamically significant stenosis by NASCET criteria, occlusion, or aneurysm unless noted above. MRA HEAD FINDINGS Internal carotid arteries:  Patent. Anterior cerebral arteries:  Patent. Middle cerebral arteries: Patent. Anterior communicating artery: Not identified, likely hypoplastic or absent. Posterior communicating arteries:  Patent.  Bilateral fetal PCA. Posterior cerebral arteries:  Patent. Basilar artery: No flow related signal within the lower and mid basilar artery excepting a short intervening segment of flow signal. Vertebral arteries:  Patent. No additional segment of high-grade stenosis, large vessel occlusion, or aneurysm. IMPRESSION: MRA neck: Patent carotid and vertebral arteries in the neck. No hemodynamically significant stenosis by NASCET criteria, occlusion, or aneurysm. 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 No additional large vessel occlusion of the intracranial circulation. These results will be called to the ordering clinician or representative by the Radiologist Assistant, and communication documented in the PACS or zVision Dashboard. Electronically Signed   By: Kristine Garbe M.D.   On: 02/01/2018 19:59   Mr Jodene Nam Neck Wo Contrast  Result Date: 02/01/2018 CLINICAL DATA:  62 y/o  M; evaluation of stroke. EXAM: MRA NECK WITHOUT CONTRAST MRA HEAD WITHOUT CONTRAST TECHNIQUE: Multiplanar and multiecho pulse sequences of the neck were obtained without intravenous contrast. Angiographic images of the neck were obtained using MRA technique without and with intravenous  contast.; Angiographic images of the Circle of Willis were obtained using MRA technique without intravenous contrast. COMPARISON:  02/01/2018 MRI of the head. FINDINGS: MRA NECK FINDINGS Aortic arch: Patent. Right common carotid artery: Patent. Right internal carotid artery: Patent. Right vertebral artery: Patent. Left common carotid artery: Patent. Left Internal carotid artery:  Patent. Left Vertebral artery: Patent. There is no evidence of hemodynamically significant stenosis by NASCET criteria, occlusion, or aneurysm unless noted above. MRA HEAD FINDINGS Internal carotid arteries:  Patent. Anterior cerebral arteries:  Patent. Middle cerebral arteries: Patent. Anterior communicating artery: Not identified, likely hypoplastic or absent. Posterior communicating arteries:  Patent.  Bilateral fetal PCA. Posterior cerebral arteries:  Patent. Basilar artery: No flow related signal within the lower and mid basilar artery excepting a short intervening segment of flow signal. Vertebral arteries:  Patent. No additional segment of high-grade stenosis, large vessel occlusion, or aneurysm. IMPRESSION: MRA neck: Patent carotid and vertebral arteries in the neck. No hemodynamically significant stenosis by NASCET criteria, occlusion, or aneurysm. 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 No additional large vessel occlusion of the intracranial circulation. These results will be called to the ordering clinician or representative by the Radiologist Assistant, and communication documented in the PACS or zVision Dashboard. Electronically Signed   By: Kristine Garbe M.D.   On: 02/01/2018 19:59   Mr Brain Wo Contrast  Result Date: 02/01/2018 CLINICAL DATA:  Acute presentation with ataxia and stroke-like symptoms. Episode of left hand numbness and tingling. EXAM: MRI HEAD WITHOUT CONTRAST TECHNIQUE: Multiplanar, multiecho pulse sequences of the brain and surrounding structures were obtained without intravenous contrast. COMPARISON:  CT yesterday. FINDINGS: Brain: Diffusion imaging shows a 6 mm subacute infarction in the left side of the pons. There is a larger region likely more acute infarction in the right side of the pons. No focal cerebellar insult. Cerebral hemispheres show mild age related volume loss small vessel or large vessel infarction. There are dilated perivascular  spaces. No sign of hemorrhage, mass, hydrocephalus or extra-axial collection Vascular: Anterior circulation vessels appear do show flow. Posterior circulation vessels are quite diminutive. Due to their small size, cannot accurately evaluate presence or absence of vertebrobasilar flow disturbance. Skull and upper cervical spine: Negative Sinuses/Orbits: Few small sinus retention cysts. No significant sinus inflammatory disease. Orbits negative. Other: None IMPRESSION: Acute and subacute infarctions affecting the pons. 6 mm probably subacute infarction in the left side of the pons. Larger, more recent infarction in the right side of the pons, with fairly low level diffusion disturbance at this time. No evidence of swelling or hemorrhage. Posterior circulation is quite diminutive. I cannot accurately assess for the potential of flow disturbance in the small posterior circulation vessels. Consider either CT angiography or MR angiography for better evaluation. Electronically Signed   By: Nelson Chimes M.D.   On: 02/01/2018 07:44     echo Study Conclusions  - Left ventricle: The cavity size was normal. Wall thickness was   increased in a pattern of mild LVH. Systolic function was normal.   Wall motion was normal; there were no regional wall motion   abnormalities. - Atrial septum: No defect or patent foramen ovale was identified.  Impressions:  - Normal echocardiogram.   There were no vitals filed for this visit.   Microbiology: No results found for this or any previous visit (from the past 240 hour(s)).     Blood Culture No results found for: SDES, SPECREQUEST, CULT, REPTSTATUS    Labs: Results for  orders placed or performed during the hospital encounter of 01/31/18 (from the past 48 hour(s))  Protime-INR     Status: None   Collection Time: 01/31/18  2:40 PM  Result Value Ref Range   Prothrombin Time 13.6 11.4 - 15.2 seconds   INR 1.05     Comment: Performed at [facility], [place] [address] [place], [place] [address]  APTT     Status: None   Collection Time: 01/31/18  2:40 PM  Result Value Ref Range   aPTT 32 24 - 36 seconds    Comment: Performed at [facility] [address], [place] [address]  CBC     Status: None   Collection Time: 01/31/18  2:40 PM  Result Value Ref Range   WBC 6.3 4.0 - 10.5 K/uL   RBC 4.97 4.22 - 5.81 MIL/uL   Hemoglobin 15.6 13.0 - 17.0 g/dL   HCT 46.9 39.0 - 52.0 %   MCV 94.4 78.0 - 100.0 fL   MCH 31.4 26.0 - 34.0 pg   MCHC 33.3 30.0 - 36.0 g/dL   RDW 13.2 11.5 - 15.5 %   Platelets 215 150 - 400 K/uL    Comment: Performed at [facility] [address] [place], [place] [address]  Differential     Status: None   Collection Time: 01/31/18  2:40 PM  Result Value Ref Range   Neutrophils Relative % 58 %   Neutro Abs 3.6 1.7 - 7.7 K/uL   Lymphocytes Relative 30 %   Lymphs Abs 1.9 0.7 - 4.0 K/uL   Monocytes Relative 10 %   Monocytes Absolute 0.6 0.1 - 1.0 K/uL   Eosinophils Relative 1 %   Eosinophils Absolute 0.1 0.0 - 0.7 K/uL   Basophils Relative 1 %   Basophils Absolute 0.0 0.0 - 0.1 K/uL   Immature Granulocytes 0 %   Abs Immature Granulocytes 0.0 0.0 - 0.1 K/uL    Comment: Performed at [facility] [address] [place], [place] [address]  Comprehensive metabolic panel     Status: Abnormal   Collection Time: 01/31/18  2:40 PM  Result Value Ref Range   Sodium 137 135 - 145 mmol/L   Potassium 3.5 3.5 - 5.1 mmol/L   Chloride 105 98 - 111 mmol/L    Comment: Please note change in reference range.   CO2 23 22 - 32 mmol/L   Glucose, Bld 105 (H) 70 - 99 mg/dL    Comment: Please note change in reference range.   BUN 9 8 - 23 mg/dL    Comment: Please note change in reference range.   Creatinine, Ser 1.05 0.61 - 1.24 mg/dL   Calcium 9.2 8.9 - 10.3 mg/dL   Total Protein 7.3 6.5 - 8.1 g/dL   Albumin 4.4 3.5 - 5.0 g/dL   AST 24 15 - 41 U/L   ALT 36 0 - 44 U/L    Comment: Please note change in  reference range.   Alkaline Phosphatase 75 38 - 126 U/L   Total Bilirubin 0.7 0.3 - 1.2 mg/dL   GFR calc non Af Amer >60 >60 mL/min   GFR calc Af Amer >60 >60 mL/min    Comment: (NOTE) The eGFR has been calculated using the CKD EPI equation. This calculation has not been validated in all clinical situations. eGFR's persistently <60 mL/min signify possible Chronic Kidney Disease.    Anion gap 9 5 - 15  Comment: Performed at [facility], [place] [address] [place], [place] [address]  I-stat troponin, ED     Status: None   Collection Time: 01/31/18  2:50 PM  Result Value Ref Range   Troponin i, poc 0.00 0.00 - 0.08 ng/mL   Comment 3            Comment: Due to the release kinetics of cTnI, a negative result within the first hours of the onset of symptoms does not rule out myocardial infarction with certainty. If myocardial infarction is still suspected, repeat the test at appropriate intervals.   I-Stat Chem 8, ED     Status: Abnormal   Collection Time: 01/31/18  2:52 PM  Result Value Ref Range   Sodium 140 135 - 145 mmol/L   Potassium 3.6 3.5 - 5.1 mmol/L   Chloride 105 98 - 111 mmol/L   BUN 11 8 - 23 mg/dL   Creatinine, Ser 0.90 0.61 - 1.24 mg/dL   Glucose, Bld 108 (H) 70 - 99 mg/dL   Calcium, Ion 1.16 1.15 - 1.40 mmol/L   TCO2 21 (L) 22 - 32 mmol/L   Hemoglobin 15.6 13.0 - 17.0 g/dL   HCT 46.0 39.0 - 52.0 %  CBG monitoring, ED     Status: None   Collection Time: 01/31/18  9:09 PM  Result Value Ref Range   Glucose-Capillary 95 70 - 99 mg/dL   Comment 1 Notify RN    Comment 2 Document in Chart   Rapid urine drug screen (hospital performed)     Status: Abnormal   Collection Time: 02/01/18 12:25 AM  Result Value Ref Range   Opiates NONE DETECTED NONE DETECTED   Cocaine NONE DETECTED NONE DETECTED   Benzodiazepines NONE DETECTED NONE DETECTED   Amphetamines NONE DETECTED NONE DETECTED   Tetrahydrocannabinol NONE DETECTED NONE DETECTED   Barbiturates (A) NONE  DETECTED    Result not available. Reagent lot number recalled by manufacturer.    Comment: Performed at [facility], [place] [address], [place], [place] [address]  HIV antibody (Routine Testing)     Status: None   Collection Time: 02/01/18  3:26 AM  Result Value Ref Range   HIV Screen 4th Generation wRfx Non Reactive Non Reactive    Comment: (NOTE) Performed At: [facility] [place], [place] [id number] Rush Farmer MD [id number]   Hemoglobin A1c     Status: Abnormal   Collection Time: 02/01/18  3:26 AM  Result Value Ref Range   Hgb A1c MFr Bld 5.8 (H) 4.8 - 5.6 %    Comment: (NOTE) Pre diabetes:          5.7%-6.4% Diabetes:              >6.4% Glycemic control for   <7.0% adults with diabetes    Mean Plasma Glucose 119.76 mg/dL    Comment: Performed at [facility] [address], [place], [place] [address]  Lipid panel     Status: Abnormal   Collection Time: 02/01/18  3:26 AM  Result Value Ref Range   Cholesterol 232 (H) 0 - 200 mg/dL   Triglycerides 233 (H) <150 mg/dL   HDL 35 (L) >40 mg/dL   Total CHOL/HDL Ratio 6.6 RATIO   VLDL 47 (H) 0 - 40 mg/dL   LDL Cholesterol 150 (H) 0 - 99 mg/dL    Comment:        Total Cholesterol/HDL:CHD Risk Coronary Heart Disease Risk Table  Men   Women  1/2 Average Risk   3.4   3.3  Average Risk       5.0   4.4  2 X Average Risk   9.6   7.1  3 X Average Risk  23.4   11.0        Use the calculated Patient Ratio above and the CHD Risk Table to determine the patient's CHD Risk.        ATP III CLASSIFICATION (LDL):  <100     mg/dL   Optimal  100-129  mg/dL   Near or Above                    Optimal  130-159  mg/dL   Borderline  160-189  mg/dL   High  >190     mg/dL   Very High Performed at [facility] [address], [place], [place] [address]      Lipid Panel     Component Value Date/Time   CHOL 232 (H) 02/01/2018 0326   TRIG 233 (H) 02/01/2018 0326   HDL 35  (L) 02/01/2018 0326   CHOLHDL 6.6 02/01/2018 0326   VLDL 47 (H) 02/01/2018 0326   LDLCALC 150 (H) 02/01/2018 0326     Lab Results  Component Value Date   HGBA1C 5.8 (H) 02/01/2018     Lab Results  Component Value Date   LDLCALC 150 (H) 02/01/2018   CREATININE 0.90 01/31/2018     HPI   (as per Dr Lorraine Lax) Wayne Richards an 62 y.o.malepast medical history of vertigo, hypertension and hyperlipidemia presents to the emergency room with about 1 week history of incoordination the left side.  Patient states that about 1 week ago while he was golfing he bent down and suddenly felt unsteady for about 10 minutes as well as had some numbness over the left hand. His symptoms resolved and returned for a few minutes while in the bathroom. He subsequently has had multiple episodes where he feels unsteady mainly on his left side. However the symptoms have persisted since yesterday night and he finally decided to come to the hospital.  CT head is unremarkable TPA not administered as he is outside the window     HOSPITAL COURSE:   Acute  [place] CVA # MRI of the brain without contrast acute and subacute infarctions in the pons.  # CTA head and neck is cancelled , iodine allergy ,ordered MRA which did not show any hemodynamically significant stenosis or occlusion or aneurysm #Transthoracic Echo  showed normal EF, no PFO, mild LVH # Patient started on aspirin and Plavix, continue aspirin 81 mg and Plavix 75 mg daily 3  MONTHS  and then aspirin alone # Start or continue Atorvastatin 80 mg/other high intensity statin # BP goal: permissive HTN upto 220/110 mmHg # Telemetry monitoring # Frequent neuro checks # Total cholesterol 232, LDL 150, triglycerides 233, hemoglobin A1c 5.8 # PT recommends outpatient PT   Asthma:stable -prn albuterol nebs -Brovana nebs discontinue IV Solu-Medrol no active wheezing  Elevated blood pressure reading:pt has hx oborderline hypertension, not on  medications.  -prnIV hydralazine for blood pressure>220. If MRI is negative for stroke and Bp ispersistently elevated, over the next 1 week, recommend starting patient on outpatient antihypertensive medications, goal less than 130 / 80         Discharge Exam:   Blood pressure 138/86, pulse 60, temperature 97.7 F (36.5 C), temperature source Oral, resp. rate 20, SpO2 99 %.  Cardiovascular system: S1 & S2 heard, RRR. No JVD, murmurs, rubs, gallops or clicks. No pedal edema. Gastrointestinal system: Abdomen is nondistended, soft and nontender. No organomegaly or masses felt. Normal bowel sounds heard. Central nervous system: Alert and oriented. No focal neurological deficits. Extremities: Symmetric 5 x 5 power. Skin: No rashes, lesions or ulcers Psychiatry: Judgement and insight appear normal. Mood & affect appropriate.          SignedReyne Dumas 02/02/2018, 9:08 AM      Time needed to  prevent discharge, discussed with the patient and family 35 minutes

## 2018-02-02 NOTE — Progress Notes (Signed)
STROKE TEAM PROGRESS NOTE  HPI ( Dr Aroor) Wayne Richards is an 62 y.o. male past medical history of vertigo, hypertension and hyperlipidemia presents to the emergency room with about 1 week history of incoordination the left side.  Patient states that about 1 week ago while he was golfing he bent down and suddenly felt unsteady for about 10 minutes as well as had some numbness over the left hand.  His symptoms resolved and returned for a few minutes while in the bathroom.  He subsequently has had multiple episodes where he feels unsteady mainly on his left side.  However the symptoms have persisted since yesterday night and he finally decided to come to the hospital.  CT head is unremarkable.  MRI Wayne is pending.  Date last known well: 01/25/18 TPA not administered as he is outside the window    INTERVAL HISTORY His wife is at the bedside.  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 Echocardiogram was unremarkable.  Vitals:   02/01/18 2328 02/02/18 0404 02/02/18 0815 02/02/18 1152  BP: (!) 157/101 (!) 146/103 138/86 (!) 131/95  Pulse: 65 (!) 57 60 (!) 56  Resp: 20 20 20 20   Temp: 98 F (36.7 C) 98.1 F (36.7 C) 97.7 F (36.5 C) 97.6 F (36.4 C)  TempSrc: Oral Oral Oral Oral  SpO2: 97% 99% 99% 99%    CBC:  Recent Labs  Lab 01/31/18 1440 01/31/18 1452  WBC 6.3  --   NEUTROABS 3.6  --   HGB 15.6 15.6  HCT 46.9 46.0  MCV 94.4  --   PLT 215  --     Basic Metabolic Panel:  Recent Labs  Lab 01/31/18 1440 01/31/18 1452  NA 137 140  K 3.5 3.6  CL 105 105  CO2 23  --   GLUCOSE 105* 108*  BUN 9 11  CREATININE 1.05 0.90  CALCIUM 9.2  --    Lipid Panel:     Component Value Date/Time   CHOL 232 (H) 02/01/2018 0326   TRIG 233 (H) 02/01/2018 0326   HDL 35 (L) 02/01/2018 0326   CHOLHDL 6.6 02/01/2018 0326   VLDL 47 (H)  02/01/2018 0326   LDLCALC 150 (H) 02/01/2018 0326   HgbA1c:  Lab Results  Component Value Date   HGBA1C 5.8 (H) 02/01/2018   Urine Drug Screen:     Component Value Date/Time   LABOPIA NONE DETECTED 02/01/2018 0025   COCAINSCRNUR NONE DETECTED 02/01/2018 0025   LABBENZ NONE DETECTED 02/01/2018 0025   AMPHETMU NONE DETECTED 02/01/2018 0025   THCU NONE DETECTED 02/01/2018 0025   LABBARB (A) 02/01/2018 0025    Result not available. Reagent lot number recalled by manufacturer.    Alcohol Level No results found for: [facility]  IMAGING Ct Head Richards Contrast  Result Date: 01/31/2018 CLINICAL DATA:  One-week history of intermittent dizziness. Mild headache. No reported injury. EXAM: CT HEAD WITHOUT CONTRAST TECHNIQUE: Contiguous axial images were obtained from the base of the skull through the vertex without intravenous contrast. COMPARISON:  None. FINDINGS: Wayne: No evidence of parenchymal hemorrhage or extra-axial fluid collection. No mass lesion, mass effect, or midline shift. No CT evidence of acute infarction. Cerebral volume is age appropriate. No ventriculomegaly. Vascular: No acute abnormality. Skull: No evidence of calvarial fracture. Sinuses/Orbits: No fluid levels. Scattered mild partial opacification of the dependent maxillary sinuses and ethmoidal air cells. Other:  The mastoid air cells are unopacified. IMPRESSION: 1.  No evidence of acute intracranial abnormality. 2. Mild paranasal sinusitis of uncertain chronicity, probably chronic. Electronically Signed   By: Delbert PhenixJason A Poff M.D.   On: 01/31/2018 16:51   Mr Wayne GlennMra Head Richards Contrast  Result Date: 02/01/2018 CLINICAL DATA:  62 y/o  M; evaluation of stroke. EXAM: MRA NECK WITHOUT CONTRAST MRA HEAD WITHOUT CONTRAST TECHNIQUE: Multiplanar and multiecho pulse sequences of the neck were obtained without intravenous contrast. Angiographic images of the neck were obtained using MRA technique without and with intravenous contast.; Angiographic images of  the Circle of Willis were obtained using MRA technique without intravenous contrast. COMPARISON:  02/01/2018 MRI of the head. FINDINGS: MRA NECK FINDINGS Aortic arch: Patent. Right common carotid artery: Patent. Right internal carotid artery: Patent. Right vertebral artery: Patent. Left common carotid artery: Patent. Left Internal carotid artery: Patent. Left Vertebral artery: Patent. There is no evidence of hemodynamically significant stenosis by NASCET criteria, occlusion, or aneurysm unless noted above. MRA HEAD FINDINGS Internal carotid arteries:  Patent. Anterior cerebral arteries:  Patent. Middle cerebral arteries: Patent. Anterior communicating artery: Not identified, likely hypoplastic or absent. Posterior communicating arteries:  Patent.  Bilateral fetal PCA. Posterior cerebral arteries:  Patent. Basilar artery: No flow related signal within the lower and mid basilar artery excepting a short intervening segment of flow signal. Vertebral arteries:  Patent. No additional segment of high-grade stenosis, large vessel occlusion, or aneurysm. IMPRESSION: MRA neck: Patent carotid and vertebral arteries in the neck. No hemodynamically significant stenosis by NASCET criteria, occlusion, or aneurysm. 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 No additional large vessel occlusion of the intracranial circulation. These results will be called to the ordering clinician or representative by the Radiologist Assistant, and communication documented in the PACS or zVision Dashboard. Electronically Signed   By: Mitzi HansenLance  Furusawa-Stratton M.D.   On: 02/01/2018 19:59   Mr Wayne GlennMra Neck Richards Contrast  Result Date: 02/01/2018 CLINICAL DATA:  62 y/o  M; evaluation of stroke. EXAM: MRA NECK WITHOUT CONTRAST MRA HEAD WITHOUT CONTRAST TECHNIQUE: Multiplanar and multiecho pulse sequences of the neck were obtained without intravenous contrast. Angiographic images of the neck were obtained using MRA technique without and  with intravenous contast.; Angiographic images of the Circle of Willis were obtained using MRA technique without intravenous contrast. COMPARISON:  02/01/2018 MRI of the head. FINDINGS: MRA NECK FINDINGS Aortic arch: Patent. Right common carotid artery: Patent. Right internal carotid artery: Patent. Right vertebral artery: Patent. Left common carotid artery: Patent. Left Internal carotid artery: Patent. Left Vertebral artery: Patent. There is no evidence of hemodynamically significant stenosis by NASCET criteria, occlusion, or aneurysm unless noted above. MRA HEAD FINDINGS Internal carotid arteries:  Patent. Anterior cerebral arteries:  Patent. Middle cerebral arteries: Patent. Anterior communicating artery: Not identified, likely hypoplastic or absent. Posterior communicating arteries:  Patent.  Bilateral fetal PCA. Posterior cerebral arteries:  Patent. Basilar artery: No flow related signal within the lower and mid basilar artery excepting a short intervening segment of flow signal. Vertebral arteries:  Patent. No additional segment of high-grade stenosis, large vessel occlusion, or aneurysm. IMPRESSION: MRA neck: Patent carotid and vertebral arteries in the neck. No hemodynamically significant stenosis by NASCET criteria, occlusion, or aneurysm. 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 No additional large vessel occlusion of the intracranial circulation. These results will be called to the ordering clinician or representative by the Radiologist Assistant, and communication documented in the PACS or zVision Dashboard.  Electronically Signed   By: Mitzi Hansen M.D.   On: 02/01/2018 19:59   Mr Wayne Richards Contrast  Result Date: 02/01/2018 CLINICAL DATA:  Acute presentation with ataxia and stroke-like symptoms. Episode of left hand numbness and tingling. EXAM: MRI HEAD WITHOUT CONTRAST TECHNIQUE: Multiplanar, multiecho pulse sequences of the Wayne and surrounding structures were  obtained without intravenous contrast. COMPARISON:  CT yesterday. FINDINGS: Wayne: Diffusion imaging shows a 6 mm subacute infarction in the left side of the pons. There is a larger region likely more acute infarction in the right side of the pons. No focal cerebellar insult. Cerebral hemispheres show mild age related volume loss small vessel or large vessel infarction. There are dilated perivascular spaces. No sign of hemorrhage, mass, hydrocephalus or extra-axial collection Vascular: Anterior circulation vessels appear do show flow. Posterior circulation vessels are quite diminutive. Due to their small size, cannot accurately evaluate presence or absence of vertebrobasilar flow disturbance. Skull and upper cervical spine: Negative Sinuses/Orbits: Few small sinus retention cysts. No significant sinus inflammatory disease. Orbits negative. Other: None IMPRESSION: Acute and subacute infarctions affecting the pons. 6 mm probably subacute infarction in the left side of the pons. Larger, more recent infarction in the right side of the pons, with fairly low level diffusion disturbance at this time. No evidence of swelling or hemorrhage. Posterior circulation is quite diminutive. I cannot accurately assess for the potential of flow disturbance in the small posterior circulation vessels. Consider either CT angiography or MR angiography for better evaluation. Electronically Signed   By: Paulina Fusi M.D.   On: 02/01/2018 07:44    PHYSICAL EXAM  Pleasant obese middle-aged Caucasian male currently not in distress. . Afebrile. Head is nontraumatic. Neck is supple without bruit.    Cardiac exam no murmur or gallop. Lungs are clear to auscultation. Distal pulses are well felt. Neurological Exam ;  Awake  Alert oriented x 3. Normal speech and language.eye movements full without nystagmus but mild saccadic dysmetria bilaterally on horizontal gaze..fundi were not visualized. Vision acuity and fields appear normal. Hearing is  normal. Palatal movements are normal. Face symmetric. Tongue midline. Normal strength, tone, reflexes but diminished fine finger movements on the left. Mild impaired left finger-to-nose and knee to heel coordination.. Normal sensation. Gait deferred.  ASSESSMENT/PLAN Mr. Madden Garron is a 62 y.o. male with history of vertigo, HTNHLD presenting with 1 week incoordination of L side.   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    CT head No acute stroke.    MRI subacute L pontine infarct, acute R pontine infarct MRA neck:: Patent carotid and vertebral arteries in the neck. No hemodynamically significant stenosis by NASCET criteria, occlusion, or aneurysm.  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 No additional large vessel occlusion of the intracranial circulation 2D Echo  Left ventricle: The cavity size was normal. Wall thickness was   increased in a pattern of mild LVH. Systolic function was normal.   Wall motion was normal; there were no regional wall motion    abnormalities.  LDL 150  HgbA1c 5.8  Lovenox 40 mg sq daily for VTE prophylaxis  No antithrombotic prior to admission, now on aspirin 325 mg daily. Given TIA with ABCD2 score >4  mild stroke, will place on aspirin 81 mg and plavix 75 mg daily x 3 weeks, then aspirin alone. Orders adjusted.   Therapy recommendations:  pending   Disposition:  pending   Ok to fly for vacation in August  Hypertension  Within limited. Stable . Permissive hypertension (OK if < 220/120) but gradually normalize in 5-7 days . Long-term BP goal normotensive  Hyperlipidemia  Home meds:  No statin listed  LDL 150, goal < 70  Add lipitor 80  Continue statin at discharge  Other Stroke Risk Factors  Family hx stroke (father)  Consider OP evaluation for Obstructive sleep apnea, will consider at time of neuro  followup  Other Active Problems  asthma  Hospital day # 1    I have personally examined this patient, reviewed notes, independently viewed imaging studies, participated in medical decision making and plan of care.ROS completed by me personally and pertinent positives fully documented  I have made any additions or clarifications directly to the above note. Agree with note above.he presented with subacute symptoms of dizziness and gait ataxia due to bilateral pontine lacunar infarcts likely from small vessel disease. Recommend dual antiplatelet therapy for 3 months followed by aspirin alone and aggressive risk factor modification. Long discussion with the patient and wife regarding his stroke and answered questions. Discussed with Dr. Susie Cassette.  Stroke team will sign off. Follow-up as an outpatient in stroke clinic in 6 weeks. Delia Heady, MD Medical Director [facility] Pager: [phone] 02/02/2018 12:52 PM   To contact Stroke Continuity provider, please refer to [email or web address]. After hours, contact General Neurology

## 2018-02-02 NOTE — Progress Notes (Signed)
Patient discharged home. Discharge instructions were reviewed with the pt. PT verbalized understanding.

## 2018-02-07 ENCOUNTER — Other Ambulatory Visit: Payer: Self-pay

## 2018-02-07 ENCOUNTER — Other Ambulatory Visit (HOSPITAL_COMMUNITY): Payer: Self-pay

## 2018-02-07 DIAGNOSIS — I639 Cerebral infarction, unspecified: Secondary | ICD-10-CM

## 2018-02-08 ENCOUNTER — Ambulatory Visit: Payer: Managed Care, Other (non HMO) | Admitting: Occupational Therapy

## 2018-02-08 ENCOUNTER — Other Ambulatory Visit: Payer: Self-pay

## 2018-02-08 ENCOUNTER — Encounter: Payer: Self-pay | Admitting: Occupational Therapy

## 2018-02-08 ENCOUNTER — Encounter: Payer: Self-pay | Admitting: Physical Therapy

## 2018-02-08 ENCOUNTER — Ambulatory Visit: Payer: Managed Care, Other (non HMO) | Attending: Internal Medicine | Admitting: Physical Therapy

## 2018-02-08 VITALS — BP 140/92

## 2018-02-08 DIAGNOSIS — R208 Other disturbances of skin sensation: Secondary | ICD-10-CM | POA: Diagnosis present

## 2018-02-08 DIAGNOSIS — R278 Other lack of coordination: Secondary | ICD-10-CM

## 2018-02-08 DIAGNOSIS — R270 Ataxia, unspecified: Secondary | ICD-10-CM | POA: Insufficient documentation

## 2018-02-08 DIAGNOSIS — I69354 Hemiplegia and hemiparesis following cerebral infarction affecting left non-dominant side: Secondary | ICD-10-CM | POA: Diagnosis not present

## 2018-02-08 DIAGNOSIS — M6281 Muscle weakness (generalized): Secondary | ICD-10-CM | POA: Diagnosis present

## 2018-02-08 DIAGNOSIS — R471 Dysarthria and anarthria: Secondary | ICD-10-CM | POA: Insufficient documentation

## 2018-02-08 DIAGNOSIS — R2681 Unsteadiness on feet: Secondary | ICD-10-CM | POA: Diagnosis present

## 2018-02-08 DIAGNOSIS — R2689 Other abnormalities of gait and mobility: Secondary | ICD-10-CM | POA: Diagnosis present

## 2018-02-08 DIAGNOSIS — I69315 Cognitive social or emotional deficit following cerebral infarction: Secondary | ICD-10-CM | POA: Insufficient documentation

## 2018-02-08 DIAGNOSIS — R1310 Dysphagia, unspecified: Secondary | ICD-10-CM | POA: Insufficient documentation

## 2018-02-08 NOTE — Therapy (Signed)
[facility] [address] [place], [place], [address] Phone: [phone]   Fax:  [phone]  Occupational Therapy Evaluation  Patient Details  Name: Wayne Richards MRN: [id number] Date of Birth: April 09, 1956 Referring Provider: Dr Lupe Carney   Encounter Date: 02/08/2018  OT End of Session - 02/08/18 1619    Visit Number  1    Number of Visits  25    Authorization Type  [employer]- need to establish if visit limit- may have three disciplines    OT Start Time  1445    OT Stop Time  1536    OT Time Calculation (min)  51 min    Behavior During Therapy  -- tearful       Past Medical History:  Diagnosis Date  . Asthma   . Stroke [facility])     Past Surgical History:  Procedure Laterality Date  . ACHILLES TENDON SURGERY      There were no vitals filed for this visit.  Subjective Assessment - 02/08/18 1602    Subjective   Things are strained    Pertinent History  hypertension, vertigo, hyperlipidemia- now with diagnosed subacute Left pons infarct, and acute right pons infarct        [facility] OT Assessment - 02/08/18 1604      Assessment   Medical Diagnosis  Right pons infarct - CVA    Referring Provider  Dr Lupe Carney    Onset Date/Surgical Date  01/24/18    Hand Dominance  Right    Prior Therapy  Was seen in acute hospital by PT/OT      Precautions   Precautions  Fall      Restrictions   Weight Bearing Restrictions  No      Balance Screen   Has the patient fallen in the past 6 months  No      Prior Function   Level of Independence  Independent;Independent with basic ADLs;Independent with household mobility without device;Independent with community mobility without device;Independent with homemaking with ambulation;Independent with gait    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, cooking, yard work, Naval architect      ADL   Eating/Feeding  Independent    Grooming  Modified  independent    Upper Body Bathing  Moderate assistance    Lower Body Bathing  Moderate assistance    Upper Body Dressing  Increased time    Lower Body Dressing  Minimal assistance    Toilet Transfer  Modified independent    Toileting - Clothing Manipulation  Modified independent    Toileting -  Hygiene  Modified Independent    Tub/Shower Transfer  Minimal assistance      IADL   Prior Level of Teacher, 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;Needs to be accompanied on any shopping trip    Prior Level of Function Light Housekeeping  Independent    Light Housekeeping  Performs light daily tasks such as dishwashing, bed making    Prior Level of Function Meal Prep  Independent    Meal Prep  Able to complete simple cold meal and snack prep    Prior Level of Function Community Mobility  Independent    Community Mobility  Relies on family or friends for transportation    Prior Level of Function Financial Management  Independent      Written Expression   Dominant Hand  Right    Handwriting  -- not  tested      Vision - History   Baseline Vision  Wears glasses only for reading    Patient Visual Report  Unable to keep objects in focus      Vision Assessment   Eye Alignment  Within Functional Limits    Ocular Range of Motion  Within Functional Limits    Tracking/Visual Pursuits  Able to track stimulus in all quads without difficulty      Activity Tolerance   Activity Tolerance  -- Significant change from prior to stroke      Cognition   Overall Cognitive Status  Impaired/Different from baseline    Cognition Comments  Patient able to share history and recen tevents - patient frequently tearful, stating, I do not feel quite as sharp as I did before      Posture/Postural Control   Posture/Postural Control  Postural limitations    Postural Limitations  Weight shift left    Posture Comments  Drifts to right with attempts to use left arm in sitting      Sensation    Light Touch  Appears Intact may be diminished in fingertips     Stereognosis  Appears Intact      Coordination   Gross Motor Movements are Fluid and Coordinated  No    Fine Motor Movements are Fluid and Coordinated  No    Finger Nose Finger Test  significant undershooting left    9 Hole Peg Test  Right;Left    Right 9 Hole Peg Test  28.50    Left 9 Hole Peg Test  1.31.35    Coordination  Lacks full digit extension      Perception   Perception  Within Functional Limits      Praxis   Praxis  Intact      ROM / Strength   AROM / PROM / Strength  AROM;Strength      AROM   Overall AROM   Deficits    AROM Assessment Site  Shoulder    Right/Left Shoulder  Left    Left Shoulder Flexion  115 Degrees difficulty sustaining    Left Shoulder ABduction  110 Degrees      Strength   Overall Strength  Deficits    Strength Assessment Site  Shoulder    Right/Left Shoulder  Left    Left Shoulder Flexion  3+/5    Left Shoulder ABduction  3+/5      Hand Function   Right Hand Gross Grasp  Functional    Right Hand Grip (lbs)  120    Right Hand Lateral Pinch  24 lbs    Left Hand Gross Grasp  Impaired    Left Hand Grip (lbs)  24    Left Hand Lateral Pinch  8 lbs                      OT Education - 02/08/18 1618    Education Details  Reviewed evaluation findings, importance of sleep, controllable stroke risk factors, and OT plan of care    Person(s) Educated  Patient    Methods  Explanation    Comprehension  Verbalized understanding       OT Short Term Goals - 02/08/18 1646      Additional Short Term Goals   Additional Short Term Goals  Yes      OT SHORT TERM GOAL #6   Title  Patient will demonstrate improved coordiantion as evidenced by reduced time on 9 hole  peg test by 30 seconds - to aide with buttoning, picking up and manipulating small objects, eg, medications    Baseline  1.31.35    Time  4    Period  Weeks    Status  New         OT Long Term Goals -  02/08/18 1637      OT LONG TERM GOAL #1   Title  Patient will complete updated HEP designed to improve range of motion and strength in left shoulder due 9/14.      Time  8    Period  Weeks    Status  New    Target Date  04/09/18      OT LONG TERM GOAL #2   Title  Patient will demonstrate sufficient stand balance and functional use of left arm to reach into high / low cabinets to obtain pots, pans, dishes as needed for cooking tasks    Time  8    Period  Weeks    Status  New      OT LONG TERM GOAL #3   Title  Patient will be demonstrate sufficient left shoulder range of motion and sustained strength to don deodarant under each arm.      Time  8    Period  Weeks    Status  New      OT LONG TERM GOAL #4   Title  Patient will demonstrate sufficient left hand strength and coordination for aide with simple tool use (patient is right hand dominant)     Time  8    Period  Weeks    Status  New      OT LONG TERM GOAL #5   Title  Patient will demonstrate 15 lb increase in grip strength in left hand to aide with opening jars, bottles, etc.      Baseline  24 lb    Time  8    Period  Weeks    Status  New            Plan - 02/08/18 1620    Clinical Impression Statement  Patient is a 62 year old male with recent hospitalization following a one week period of intermittent dizziness and left sided numbness.  Patient with findings of L subacute infarct in pons, and R acute larger infarct in pons.  Patient also with "diminuitive posterior blood vessels" per MRA.  Patient scheduled for follow up CT as he reports declining function since discharge from hospital.  Patient presents to OT eval today with significant left sided weakness and decreased active range of motion, decreased balance and coordination, blurred vision, and reduced activity tolerance all of which impede his performance of daily self care and IADL skills.  Patient tearful during evaluation.  Patient will benefit from skilled OT  intervention to improve his ability to return to prior level of function.      Occupational Profile and client history currently impacting functional performance  Husband, father of 41- youngest is 97, retired Emergency planning/management officer, Teacher, English as a foreign language, Production designer, theatre/television/film of household    Occupational performance deficits (Please refer to evaluation for details):  ADL's;IADL's;Rest and Sleep;Work;Leisure    Rehab Potential  Excellent    Current Impairments/barriers affecting progress:  patient still under MD care to determine if stroke is stable - F/U CT scheduled this week    OT Frequency  3x / week depending on VL    OT Duration  8 weeks Depends on VL  OT Treatment/Interventions  Self-care/ADL training;Electrical Stimulation;Therapeutic exercise;Visual/perceptual remediation/compensation;Coping strategies training;Aquatic Therapy;Neuromuscular education;Splinting;Patient/family education;Balance training;Therapeutic activities;Functional Mobility Training;Energy conservation;DME and/or AE instruction;Manual Therapy;Cognitive remediation/compensation    Plan  Establish HEP for coord, hand strength, NMR LUE, STAND BALANCE    Clinical Decision Making  Several treatment options, min-mod task modification necessary    Recommended Other Services  ?Neuropsych    Consulted and Agree with Plan of Care  Patient       Patient will benefit from skilled therapeutic intervention in order to improve the following deficits and impairments:  Decreased cognition, Decreased knowledge of use of DME, Impaired flexibility, Impaired vision/preception, Improper body mechanics, Impaired sensation, Decreased mobility, Decreased coordination, Cardiopulmonary status limiting activity, Decreased activity tolerance, Decreased endurance, Decreased range of motion, Decreased strength, Impaired tone, Decreased coping skills, Decreased balance, Difficulty walking, Impaired perceived functional ability, Impaired UE functional use  Visit Diagnosis: Hemiplegia  and hemiparesis following cerebral infarction affecting left non-dominant side (HCC) - Plan: Ot plan of care cert/re-cert, Ot plan of care cert/re-cert  Unsteadiness on feet - Plan: Ot plan of care cert/re-cert, Ot plan of care cert/re-cert  Muscle weakness (generalized) - Plan: Ot plan of care cert/re-cert, Ot plan of care cert/re-cert  Other lack of coordination - Plan: Ot plan of care cert/re-cert, Ot plan of care cert/re-cert  Other disturbances of skin sensation - Plan: Ot plan of care cert/re-cert, Ot plan of care cert/re-cert  Ataxia - Plan: Ot plan of care cert/re-cert, Ot plan of care cert/re-cert  Cognitive social or emotional deficit following cerebral infarction - Plan: Ot plan of care cert/re-cert, Ot plan of care cert/re-cert    Problem List Patient Active Problem List   Diagnosis Date Noted  . Cerebral embolism with cerebral infarction 02/01/2018  . 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 Stroke-like symptoms   . Ataxia 01/31/2018  . Elevated blood pressure reading 01/31/2018  . Asthma     Collier Salina, OTR/L 02/08/2018, 4:59 PM  [facility] [address] [place], [place], [address] Phone: [phone]   Fax:  [phone]  Name: Wayne Richards MRN: [id number] Date of Birth: Jul 01, 1956

## 2018-02-08 NOTE — Patient Instructions (Signed)
HIP: Abduction - Supine    Move leg away from body. Keep knee and toes straight. _10__ reps per set, _1__ sets per day, ___5 days per week Place board under leg to decrease friction. Place skate under leg.  Copyright  VHI. All rights reserved.  Straight Leg Raise    Tighten stomach and slowly raise locked right leg _10___ inches from floor. Repeat __10__ times per set. Do __1__ sets per session. Do 2__ sessions per day.  http://orth.exer.us/1102   Copyright  VHI. All rights reserved.  HIP: Extension, Bridging Unilateral     10 times/day -- 1-2 sets/day Copyright  VHI. All rights reserved.

## 2018-02-09 ENCOUNTER — Telehealth: Payer: Self-pay

## 2018-02-09 ENCOUNTER — Ambulatory Visit: Payer: Managed Care, Other (non HMO)

## 2018-02-09 ENCOUNTER — Encounter: Payer: Self-pay | Admitting: Occupational Therapy

## 2018-02-09 ENCOUNTER — Ambulatory Visit: Payer: Managed Care, Other (non HMO) | Admitting: Occupational Therapy

## 2018-02-09 VITALS — BP 144/92

## 2018-02-09 DIAGNOSIS — M6281 Muscle weakness (generalized): Secondary | ICD-10-CM

## 2018-02-09 DIAGNOSIS — R208 Other disturbances of skin sensation: Secondary | ICD-10-CM

## 2018-02-09 DIAGNOSIS — R278 Other lack of coordination: Secondary | ICD-10-CM

## 2018-02-09 DIAGNOSIS — I69354 Hemiplegia and hemiparesis following cerebral infarction affecting left non-dominant side: Secondary | ICD-10-CM | POA: Diagnosis not present

## 2018-02-09 DIAGNOSIS — R1310 Dysphagia, unspecified: Secondary | ICD-10-CM

## 2018-02-09 DIAGNOSIS — R471 Dysarthria and anarthria: Secondary | ICD-10-CM

## 2018-02-09 NOTE — Therapy (Signed)
[facility] [address] [place], [place], [address] Phone: [phone]   Fax:  [phone]  Physical Therapy Evaluation  Patient Details  Name: Wayne Richards MRN: [id number] Date of Birth: 25-Mar-1956 Referring Provider: Dr. Lupe Carney   Encounter Date: 02/08/2018  PT End of Session - 02/09/18 2221    Visit Number  1    Number of Visits  17    Date for PT Re-Evaluation  04/11/18    Authorization Type  [employer] - awaiting to hear number visits authorized    PT Start Time  1401    PT Stop Time  1445    PT Time Calculation (min)  44 min       Past Medical History:  Diagnosis Date  . Asthma   . Stroke [facility])     Past Surgical History:  Procedure Laterality Date  . ACHILLES TENDON SURGERY      Vitals:   02/08/18 1452  BP: (!) 140/92     Subjective Assessment - 02/09/18 2205    Subjective  Pt states he had CVA on 01-25-18; but did not go to ED until 01-31-18 as symptoms progressed:  pt states problems and deficits did not occur until after Discharge from hospital on 02-03-18; pt is using a RW for assistance with ambulation with difficulty advancing LLE  due to weakness    Patient is accompained by:  -- wife accompanied pt but did not stay for evaluation    Pertinent History  ataxia; asthma: HTN: acute Rt pontine infarct:  subacute Lt pontine infarct     Diagnostic tests  CT scan scheduled for tomorrow    Patient Stated Goals  "Regain my normal function"    Currently in Pain?  No/denies         [facility] PT Assessment - 02/09/18 0001      Assessment   Medical Diagnosis  Rt pontine infarct    Referring Provider  Dr. Lupe Carney    Onset Date/Surgical Date  01/24/18    Hand Dominance  Right    Prior Therapy  was seen on acute for PT and OT      Precautions   Precautions  Fall      Restrictions   Weight Bearing Restrictions  No      Balance Screen   Has the patient fallen in the past 6 months  No    Has  the patient had a decrease in activity level because of a fear of falling?   No    Is the patient reluctant to leave their home because of a fear of falling?   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      ROM / Strength   AROM / PROM / Strength  Strength      AROM   Overall AROM   Deficits      Strength   Overall Strength  Deficits  Strength Assessment Site  Hip;Knee;Ankle    Right/Left Hip  Left    Left Hip Flexion  3+/5    Left Hip Extension  3-/5    Left Hip ABduction  3-/5    Right/Left Knee  Left    Left Knee Flexion  2-/5    Left Knee Extension  3-/5    Right/Left Ankle  Left    Left Ankle Dorsiflexion  3+/5    Left Ankle Plantar Flexion  2/5      Ambulation/Gait   Ambulation/Gait  Yes    Ambulation/Gait Assistance  5: Supervision    Ambulation Distance (Feet)  60 Feet    Assistive device  Rolling walker    Gait Pattern  Decreased stance time - left;Decreased hip/knee flexion - left;Decreased dorsiflexion - left;Decreased weight shift to left;Decreased trunk rotation    Ambulation Surface  Level;Indoor    Gait velocity  29.43 = 1.11 ft/sec with RW      Standardized Balance Assessment   Standardized Balance Assessment  Timed Up and Go Test      Timed Up and Go Test   Normal TUG (seconds)  28.44 with RW                Objective measurements completed on examination: See above findings.              PT Education - 02/09/18 2220    Education Details  LLE strengthening - see pt instructions    Person(s) Educated  Patient    Methods  Explanation;Demonstration;Handout     Comprehension  Verbalized understanding;Returned demonstration       PT Short Term Goals - 02/09/18 2233      PT SHORT TERM GOAL #1   Title  Improve Berg balance test score by at least 5 points to reduce fall risk.    Time  4    Period  Weeks    Status  New    Target Date  03/11/18      PT SHORT TERM GOAL #2   Title  Improve TUG score from 28.44 secs to </= 23 secs with RW to demo improved mobility.    Baseline  28.44 secs wtih RW    Time  4    Period  Weeks    Status  New    Target Date  03/11/18      PT SHORT TERM GOAL #3   Title  Amb. 100' with SPC with CGA on flat, even surface.    Time  4    Period  Weeks    Status  New    Target Date  03/11/18      PT SHORT TERM GOAL #4   Title  Increase gait velocity from 1.11 ft/sec to >/= 1.5 ft/sec with RW for incr. gait efficiency.    Baseline  29.43 secs = 1.11 ft/sec    Time  4    Period  Weeks    Status  New    Target Date  03/11/18      PT SHORT TERM GOAL #5   Title  Independent in HEP for LLE strengthening.    Time  4    Period  Weeks    Status  New    Target Date  03/11/18        PT Long Term Goals - 02/09/18 2238      PT LONG TERM GOAL #1   Title  Increase Berg balance test by at least 10  points to reduce fall risk.    Time  8    Period  Weeks    Status  New    Target Date  04/11/18      PT LONG TERM GOAL #2   Title  Improve TUG score from 28.44 secs to </= 18 secs with RW for incr. functional mobility.    Baseline  28.44 secs with RW    Time  8    Period  Weeks    Status  New    Target Date  04/11/18      PT LONG TERM GOAL #3   Title  Pt will be modified independent without device for household ambulation.    Time  8    Period  Weeks    Status  New    Target Date  04/11/18      PT LONG TERM GOAL #4   Title  Amb. 500' on even/uneven surfaces with SPC for increased community accessibility.      Time  8    Period  Weeks    Status  New    Target Date  04/11/18      PT LONG TERM GOAL #5    Title  Negotiate 4 steps with 1 rail using a step over step sequence.    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  Pt had onset of symptoms on 01-24-18 but did not go to ED until 7-8-19l pt was hospitalized until 02-03-18.  Pt states his symptoms progressed signficantly after discharge from hospital.  Pt presents with LLE weakness and decr. functional use and decrased coordination.  Pt also presents with gait and balance deficits and is currently using a RW for assistance with ambulation.     History and Personal Factors relevant to plan of care:  Pt played golf for leisure activity; states symptoms worsened after D/C home from hospital; pt is retired; is retired and has 3 grown children    Clinical Presentation  Evolving    Clinical Presentation due to:  acute Rt pontine infarct and subacute Lt pontine infarct    Clinical Decision Making  Moderate    Rehab Potential  Good    PT Frequency  2x / week    PT Duration  8 weeks    PT Treatment/Interventions  ADLs/Self Care Home Management;Aquatic Therapy;Functional mobility training;Stair training;Gait training;DME Instruction;Therapeutic activities;Therapeutic exercise;Balance training;Neuromuscular re-education;Patient/family education;Passive range of motion;Electrical Stimulation    PT Next Visit Plan  do Berg test; check exercises given for HEP on 02-08-18:  LLE strengthening; gait training    PT Home Exercise Plan  LLE strengthening - see pt instructions - add as appropriate    Recommended Other Services  pt is receiving OT and ST; pt may need AFO for LLE - will assess need    Consulted and Agree with Plan of Care  Patient       Patient will benefit from skilled therapeutic intervention in order to improve the following  deficits and impairments:  Abnormal gait, Decreased activity tolerance, Decreased balance, Decreased coordination, Decreased range of motion, Decreased knowledge of use of DME, Decreased endurance, Decreased strength, Impaired  vision/preception, Impaired tone, Impaired sensation, Impaired UE functional use  Visit Diagnosis: Hemiplegia and hemiparesis following cerebral infarction affecting left non-dominant side (HCC) - Plan: PT plan of care cert/re-cert  Other abnormalities of gait and mobility - Plan: PT plan of care cert/re-cert  Muscle weakness (generalized) - Plan: PT plan of care cert/re-cert  Unsteadiness on feet - Plan: PT plan of care cert/re-cert     Problem List Patient Active Problem List   Diagnosis Date Noted  . Cerebral embolism with cerebral infarction 02/01/2018  . 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 Stroke-like symptoms   . Ataxia 01/31/2018  . Elevated blood pressure reading 01/31/2018  . Asthma     Braylee Bosher, Donavan BurnetLinda Suzanne, PT 02/09/2018, 10:48 PM  [facility] [address] [place], [place], [address] Phone: [phone]   Fax:  [phone]  Name: Nilda SimmerFrank Kirkendoll MRN: [id number] Date of Birth: [birth year]

## 2018-02-09 NOTE — Telephone Encounter (Signed)
Revised. 

## 2018-02-09 NOTE — Telephone Encounter (Addendum)
Rn was notified that Wayne Richards and wife were in the waiting room. Rn was notified that pts wife was told by therapy that her husband may have a bleed from the stroke he had last week while hospitalized. During the story it was a Engineer, civil (consulting)nurse from [employer] not the RN. Rn went to the waiting room to see Wayne Richards and wife. The Wayne Richards was in therapy, and wife was in the waiting room. The wife began to explain that the night before her husband got discharge from hospital she notice her husband left side was weaker,and face was drooping. She notice all these sympts while he was in the hospital. Pts wife stated she did not report these to the nursing staff because she thought they knew.  He was also having speech problems,and difficulty swallowing one discharge. The wife also stated a nurse specialist from the insurance company was told of his symptoms and stated he may have a bleed, and that Wayne Richards should have a CT scan. RN ask wife when did the nurse see the Wayne Richards to assess him.The wife stated the nurse was communicating with her via telephone for their insurance company. The nurse from the insurance company  who never saw the Wayne Richards stated the plavix could be could causing a bleed. Rn explain Wayne Richards may not have a bleed until he gets the CT scan tomorrow. The Wayne Richards saw Dr.Dean Clovis RileyMitchell his PCP who order a CT scan. The Wayne Richards [facility] at 0830am. Rn stated to wife per Dr. Pearlean BrownieSethi Wayne Richards can stop the plavix until he has the Ct scan tomorrow,and just take the Asprin.Rn stated Dr. Pearlean BrownieSethi will look at the scan tomorrow,and see If there were any changes. Rn advised wife that if her husband gets worse with sympts to see the nearest ED. The wife verbalized understanding.

## 2018-02-09 NOTE — Patient Instructions (Signed)
  Coordination Activities  Perform the following activities for 20 minutes 1 times per day with left hand(s).   Flip cards 1 at a time, then push cards off the top of the deck by extending fingers  Deal cards with your thumb (Hold deck in hand and push card off top with thumb).  Pick up coins and place in container or coin bank.  Pick up coins and stack.  Pick up coins one at a time until you get 5-10 in your hand, then move coins from palm to fingertips to place in container   1. Grip Strengthening (Resistive Putty)   Squeeze putty using thumb and all fingers. Repeat _20___ times. Do __2__ sessions per day.   2. Roll putty into tube on table and pinch between index finger and thumb x 10 reps each.(eventually progress to pinching with  (can do ring and small finger together)  3 then pinch with the side of your index finger key pinch 10 reps     Copyright  VHI. All rights reserved.

## 2018-02-09 NOTE — Therapy (Signed)
[facility] [address] [place], [place], [address] Phone: [phone]   Fax:  [phone]  Occupational Therapy Treatment  Patient Details  Name: Wayne SimmerFrank Ennen MRN: [id number] Date of Birth: [birth year] Referring Provider: Dr Lupe Carneyean Mitchell   Encounter Date: 02/09/2018  OT End of Session - 02/09/18 1510    Visit Number  2    Number of Visits  25    Authorization Type  [employer]etna- need to establish if visit limit- may have three disciplines    OT Start Time  1450    OT Stop Time  1530    OT Time Calculation (min)  40 min       Past Medical History:  Diagnosis Date  . Asthma   . Stroke [facility](HCC)     Past Surgical History:  Procedure Laterality Date  . ACHILLES TENDON SURGERY      Vitals:   02/09/18 1457  BP: (!) 144/92    Subjective Assessment - 02/09/18 1913    Pertinent History  hypertension, vertigo, hyperlipidemia- now with diagnosed subacute Left pons infarct, and acute right pons infarct    Currently in Pain?  No/denies         Treatment: Pt was instructed in HEP for coordination and red putty. Pt required mod v.c to avoid compensation. Pt was cautioned against overhead reaching, and use of weights due to risk for injury. Pt is scheduled for a CT scan, he expressed anxiety regarding his diagnosis.                   OT Education - 02/09/18 1920    Education Details  discussed ishemic CVA vs. hemorhagic CVA as pt has question/ concerns, coordination HEP, red putty HEP(pt has putty at home)    Person(s) Educated  Patient    Methods  Explanation;Demonstration;Verbal cues    Comprehension  Verbalized understanding;Returned demonstration       OT Short Term Goals - 02/08/18 1655      OT SHORT TERM GOAL #1   Title  Patient will complete a HEP designed to improve coord in left hand independently due 03/10/18    Time  4    Period  Weeks    Status  New      OT SHORT TERM GOAL #2   Title   Patient will complete a HEP designed to improve strength in left hand independently     Time  4    Period  Weeks    Status  New      OT SHORT TERM GOAL #3   Title  Patient will dress lower body with no more than intermittent min assist    Time  4    Period  Weeks      OT SHORT TERM GOAL #4   Title  Patient will bathe himself in the shower with no more than intermittent min assist    Time  4    Period  Weeks    Status  New      OT SHORT TERM GOAL #5   Title  Patient will prepare simple hot meal (using stove top or microwave) for he and his wife with modified independence    Time  4    Period  Weeks    Status  New      OT SHORT TERM GOAL #6   Title  Patient will demonstrate improved coordiantion as evidenced by reduced time on 9 hole peg test by 30 seconds -  to aide with buttoning, picking up and manipulating small objects, eg, medications    Baseline  1.31.35    Time  4    Period  Weeks    Status  New        OT Long Term Goals - 02/08/18 1637      OT LONG TERM GOAL #1   Title  Patient will complete updated HEP designed to improve range of motion and strength in left shoulder due 9/14.      Time  8    Period  Weeks    Status  New    Target Date  04/09/18      OT LONG TERM GOAL #2   Title  Patient will demonstrate sufficient stand balance and functional use of left arm to reach into high / low cabinets to obtain pots, pans, dishes as needed for cooking tasks    Time  8    Period  Weeks    Status  New      OT LONG TERM GOAL #3   Title  Patient will be demonstrate sufficient left shoulder range of motion and sustained strength to don deodarant under each arm.      Time  8    Period  Weeks    Status  New      OT LONG TERM GOAL #4   Title  Patient will demonstrate sufficient left hand strength and coordination for aide with simple tool use (patient is right hand dominant)     Time  8    Period  Weeks    Status  New      OT LONG TERM GOAL #5   Title  Patient will  demonstrate 15 lb increase in grip strength in left hand to aide with opening jars, bottles, etc.      Baseline  24 lb    Time  8    Period  Weeks    Status  New            Plan - 02/09/18 1917    Clinical Impression Statement  Pt is progressing towards goals. He demonstrates understanding of coordination and putty HEP.    Occupational Profile and client history currently impacting functional performance  Husband, father of 12- youngest is 2, retired Emergency planning/management officer, Teacher, English as a foreign language, Production designer, theatre/television/film of household    Occupational performance deficits (Please refer to evaluation for details):  ADL's;IADL's;Rest and Sleep;Work;Leisure    Rehab Potential  Excellent    Current Impairments/barriers affecting progress:  patient still under MD care to determine if stroke is stable - F/U CT scheduled this week    OT Frequency  3x / week    OT Duration  8 weeks    OT Treatment/Interventions  Self-care/ADL training;Electrical Stimulation;Therapeutic exercise;Visual/perceptual remediation/compensation;Coping strategies training;Aquatic Therapy;Neuromuscular education;Splinting;Patient/family education;Balance training;Therapeutic activities;Functional Mobility Training;Energy conservation;DME and/or AE instruction;Manual Therapy;Cognitive remediation/compensation    Plan  NMR LUE, HEP L shoulder consider ball ex in supine    Recommended Other Services  ?Neuropsych    Consulted and Agree with Plan of Care  Patient       Patient will benefit from skilled therapeutic intervention in order to improve the following deficits and impairments:  Decreased cognition, Decreased knowledge of use of DME, Impaired flexibility, Impaired vision/preception, Improper body mechanics, Impaired sensation, Decreased mobility, Decreased coordination, Cardiopulmonary status limiting activity, Decreased activity tolerance, Decreased endurance, Decreased range of motion, Decreased strength, Impaired tone, Decreased coping skills, Decreased  balance, Difficulty walking, Impaired perceived functional ability, Impaired UE  functional use  Visit Diagnosis: Muscle weakness (generalized)  Other lack of coordination  Other disturbances of skin sensation    Problem List Patient Active Problem List   Diagnosis Date Noted  . Cerebral embolism with cerebral infarction 02/01/2018  . 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 Stroke-like symptoms   . Ataxia 01/31/2018  . Elevated blood pressure reading 01/31/2018  . Asthma     RINE,KATHRYN 02/09/2018, 7:25 PM  [facility] [address] [place], [place], [address] Phone: [phone]   Fax:  [phone]  Name: Armond Cuthrell MRN: [id number] Date of Birth: [birth year]

## 2018-02-10 ENCOUNTER — Encounter (HOSPITAL_COMMUNITY): Payer: Self-pay

## 2018-02-10 ENCOUNTER — Ambulatory Visit (HOSPITAL_COMMUNITY)
Admission: RE | Admit: 2018-02-10 | Discharge: 2018-02-10 | Disposition: A | Discharge status: Home / Self Care | Payer: Managed Care, Other (non HMO) | Source: Ambulatory Visit | Attending: Family Medicine | Admitting: Family Medicine

## 2018-02-10 DIAGNOSIS — R1310 Dysphagia, unspecified: Secondary | ICD-10-CM | POA: Insufficient documentation

## 2018-02-10 DIAGNOSIS — I639 Cerebral infarction, unspecified: Secondary | ICD-10-CM | POA: Diagnosis not present

## 2018-02-10 DIAGNOSIS — I6529 Occlusion and stenosis of unspecified carotid artery: Secondary | ICD-10-CM | POA: Diagnosis not present

## 2018-02-10 NOTE — Patient Instructions (Signed)
You could benefit from a short course of ST to habitualize slower rate and other speech compensations but I respect your decision to focus on OT and PT at this time.

## 2018-02-10 NOTE — Therapy (Signed)
[facility] [address] [place], [place], [address] Phone: [phone]   Fax:  [phone]  Speech Language Pathology Evaluation  Patient Details  Name: Wayne Richards MRN: [id number] Date of Birth: 1955-09-20 Referring Provider: Lupe Carney, MD   Encounter Date: 02/09/2018  End of Session - 02/10/18 1144    Visit Number  1    Number of Visits  13    Date for SLP Re-Evaluation  04/15/18 end date pushed out in case pt desires ST in the next 2-3 weeks    Authorization Type  pt does not desire ST at this time, however goals will be written adn an end date will be provided incase pt desires to inititate ST in the next two-three weeks    SLP Start Time  1404    SLP Stop Time   1445    SLP Time Calculation (min)  41 min    Activity Tolerance  Patient tolerated treatment well       Past Medical History:  Diagnosis Date  . Asthma   . Stroke [facility])     Past Surgical History:  Procedure Laterality Date  . ACHILLES TENDON SURGERY      There were no vitals filed for this visit.      SPEECH Evaluation [facility] - 02/09/18 0001      SLP Visit Information   SLP Received On  02/09/18    Referring Provider  Lupe Carney, MD    Onset Date  CVA- 01-25-18; deficits- 02-03-18    Medical Diagnosis  CVA      Subjective   Subjective  Pt with appointment at neurologist to assess for ongoing bleed as pt's abilities have declined since d/c from hospital. According to pt's chart, acute ST saw pt and signed off due to WFL/WNL abilities in cognitive linguistics and swallowing.       General Information   HPI  62 y.o. male with medical history significant of asthma, borderline hypertension, vertigo, who presented 01-31-18 with left hand numbness and tingling, dizziness and difficulty walking since approx 01-25-18. MRI cute and subacute infarctions affecting the pons. 6 mm probably subacute infarction in the left side of the pons. Larger,  more recent infarction in the right side of the pons. Pt's skills have declined since d/c from hospital on 02-03-18.      Prior Functional Status   Cognitive/Linguistic Baseline  Within functional limits    Type of Home  House      Cognition   Overall Cognitive Status  -- For purposes of eval today, pt appeared [facility]      Auditory Comprehension   Overall Auditory Comprehension  Appears within functional limits for tasks assessed      Verbal Expression   Overall Verbal Expression  Appears within functional limits for tasks assessed      Oral Motor/Sensory Function   Overall Oral Motor/Sensory Function  Impaired    Labial ROM  Reduced left    Labial Symmetry  Abnormal symmetry left    Labial Strength  Reduced Left very slight    Lingual Symmetry  Abnormal symmetry left VERY slight    Lingual Strength  Reduced weakness >lt than on rt    Lingual Coordination  Reduced    Facial ROM  Within Functional Limits    Facial Symmetry  Within Functional Limits    Velum  Within Functional Limits      Motor Speech   Overall Motor Speech  Impaired  Articulation  Impaired    Level of Impairment  Sentence    Intelligibility  Intelligible    Motor Planning  Impaired    Level of Impairment  Sentence    Effective Techniques  Slow rate       SWALLOWING ASSESSMENT (Bedside swallow eval) -[facility] 02/09/18 Subjective Assessment - 02/10/18 1157      Symptoms/Limitations   Subjective  Pt states that when he swallows liquids he needs to "focus or they will go down wrong"       General - 02/10/18 1158      General Information   Type of Study  Bedside Swallow Evaluation    Diet Prior to this Study  Regular    Temperature Spikes Noted  No    Respiratory Status  Room air    History of Recent Intubation  No    Behavior/Cognition  Alert;Cooperative;Pleasant mood    Oral Cavity Assessment  Within Functional Limits    Oral Care Completed by SLP  No    Oral Cavity - Dentition  Adequate natural  dentition    Vision  Functional for self-feeding    Self-Feeding Abilities  Able to feed self    Patient Positioning  Upright in chair    Baseline Vocal Quality  Normal    Volitional Cough  Strong    Volitional Swallow  Able to elicit       Oral Motor/Sensory Function - 02/10/18 1159      Oral Motor/Sensory Function   Overall Oral Motor/Sensory Function  Mild impairment see dysarthria evaluation for oral motor assessment       Thin Liquid - 02/10/18 1159      Thin Liquid   Thin Liquid  Impaired    Presentation  Cup    Oral Phase Functional Implications  Other (comment) pt stated need to "focus" on liquid swallows before swallowing    Pharyngeal  Phase Impairments  Multiple swallows         Solid - 02/10/18 1201      Solid   Solid  Impaired    Oral Phase Functional Implications  Right lateral sulci pocketing pt making routine lingual sweeps into rt lateral sulcus and reports need to do this during meals as well as apply pressure to cheek to push food in sulcus back into lingual resting area for oral transit          SLP Education - 02/10/18 1143    Education Details  eval results, benefits of ST, contact MD if swallow function worsens   Person(s) Educated  Patient    Methods  Explanation    Comprehension  Verbalized understanding       SLP Short Term Goals - 02/10/18 1150      SLP SHORT TERM GOAL #1   Title  pt will complete dysarthria HEP with rare min A over two sessions    Time  4    Period  Weeks    Status  New      SLP SHORT TERM GOAL #2   Title  pt will complete dysphagia HEP with rare min A over two sessions    Time  4    Period  Weeks    Status  New      SLP SHORT TERM GOAL #3   Title  pt will use speech compensations 75% of the time in 10 mintues simple conversation     Time  4    Period  Weeks    Status  New       SLP Long Term Goals - 02/10/18 1152      SLP LONG TERM GOAL #1   Title  pt will complete dysarthria and dysphagia HEPs with  modified independence over three sessions    Time  6    Period  Weeks or 13 total visits, for all LTGs    Status  New      SLP LONG TERM GOAL #2   Title  pt will use speech compensations in 15 minutes mod complex conversation over two sessions    Time  6    Period  Weeks    Status  New       Plan - 02/10/18 1145    Clinical Impression Statement  Pt presents with reduced speech fluidity at this time - mild halting and hesitation observed; dysarthria vs. apraxia are possibilities. Pt tells SLP he must "be careful" when he drinks and that solids regularly create residue in his rt lateral sulcus. Pt does not desire ST at this time and would like to focus on PT and OT. I believe he could benefit from a short course of ST in the near future, if pt desires, to focus on speech clarity and fluidity and to ensure safety with POs.    Speech Therapy Frequency  2x / week    Duration  -- 6 weeks or 13 total sessions    Treatment/Interventions  SLP instruction and feedback;Oral motor exercises;Compensatory strategies;Functional tasks;Internal/external aids;Patient/family education    Potential to Achieve Goals  Good    Consulted and Agree with Plan of Care  Patient       Patient will benefit from skilled therapeutic intervention in order to improve the following deficits and impairments:   Dysarthria and anarthria  Dysphagia, unspecified type    Problem List Patient Active Problem List   Diagnosis Date Noted  . Cerebral embolism with cerebral infarction 02/01/2018  . 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 Stroke-like symptoms   . Ataxia 01/31/2018  . Elevated blood pressure reading 01/31/2018  . Asthma     SCHINKE,CARL ,MS, CCC-SLP  02/10/2018, 12:03 PM  [facility] [address] [place], [place], [address] Phone: [phone]   Fax:  [phone]  Name: Nilda SimmerFrank Cieslak MRN: [id number] Date of Birth: [birth year]

## 2018-02-11 ENCOUNTER — Encounter: Payer: Self-pay | Admitting: Adult Health

## 2018-02-14 ENCOUNTER — Encounter: Payer: Self-pay | Admitting: Physical Therapy

## 2018-02-14 ENCOUNTER — Ambulatory Visit: Payer: Managed Care, Other (non HMO) | Admitting: Physical Therapy

## 2018-02-14 ENCOUNTER — Other Ambulatory Visit: Payer: Self-pay

## 2018-02-14 ENCOUNTER — Telehealth: Payer: Self-pay

## 2018-02-14 ENCOUNTER — Ambulatory Visit: Payer: Managed Care, Other (non HMO) | Admitting: Occupational Therapy

## 2018-02-14 ENCOUNTER — Encounter: Payer: Self-pay | Admitting: Occupational Therapy

## 2018-02-14 VITALS — BP 149/85

## 2018-02-14 DIAGNOSIS — I69354 Hemiplegia and hemiparesis following cerebral infarction affecting left non-dominant side: Secondary | ICD-10-CM

## 2018-02-14 DIAGNOSIS — R2681 Unsteadiness on feet: Secondary | ICD-10-CM

## 2018-02-14 DIAGNOSIS — R208 Other disturbances of skin sensation: Secondary | ICD-10-CM

## 2018-02-14 DIAGNOSIS — R278 Other lack of coordination: Secondary | ICD-10-CM

## 2018-02-14 DIAGNOSIS — R2689 Other abnormalities of gait and mobility: Secondary | ICD-10-CM

## 2018-02-14 DIAGNOSIS — M6281 Muscle weakness (generalized): Secondary | ICD-10-CM

## 2018-02-14 DIAGNOSIS — R270 Ataxia, unspecified: Secondary | ICD-10-CM

## 2018-02-14 NOTE — Patient Outreach (Signed)
[facility] Care Management  02/14/2018  Wayne SimmerFrank Richards May 31, 1956 [id number]  EMMI: stroke red alert Referral date: 02/14/18 Referral reason: sad, hopeless, empty: yes Insurance:  [employer] Day # 9  Telephone call to patient regarding EMMI stroke red alert. HIPAA verified with patient. Explained reason for call. Patient states he has had symptoms of feeling sad, hopeless but he has handled this. RNCM inquired if patient has had follow up with his primary MD since discharge from hospital. Patient states he had and he has discussed his feelings with his doctor and his doctor has been very helpful. Patient states he does not need any additional follow up related to this.  Patient reports he has his appointment scheduled with the neurologist. He states he has transportation to his appointment. Patient states he has his medications and is taking them as prescribed. Patient denies any further needs or concerns at this time.  RNCM reviewed signs/ symptoms of stroke with patient. Advised patient that 911 should be called for stroke like symptoms. Advised patient to call doctor for non emergent symptoms. Patient verbalized understanding.   RNCM advised patient that he would continue to get automated EMMI-stroke  calls to assess how he is doing following recent hospitalization and will receive a call from a nurse if any of his  responses were abnormal. Patient voiced understanding and was appreciative of follow up call. Patient verbally agreed to ongoing automated EMMI stroke calls.   PLAN; RNCM will close patient due to patient being assessed and having no further needs  George InaDavina Dyna Figuereo RN,BSN,CCM [facility] Telephonic  [phone]     PLAN:

## 2018-02-14 NOTE — Patient Instructions (Signed)
STOP DOING SINGLE LEG BRIDGE FOR NOW; SWITCH TO:  Bracing With March in Bridging (Hook-Lying)    With neutral spine, tighten pelvic floor and abdominals and hold. Lift bottom and hold, then march in place. March _5__ times. Rest.  Do 5 more.    SINGLE LIMB STANCE    Hold counter or chair with one hand.  Stance: single leg on floor. Raise leg. Hold _10__ seconds. Repeat with other leg. __2-3_ reps per set  Balancing Act    In a standing position, go up on toes and down. Then back on heels. For balance, use support or put arms out in front. Repeat __10__ times.   Tandem Standing    Stand heel to toe holding chair or counter with one hand.  Let go and hold your balance x 10 seconds.  Switch feet and repeat.  Perform 2-3 times each side.  http://gt2.exer.us/541   Copyright  VHI. All rights reserved.

## 2018-02-14 NOTE — Telephone Encounter (Signed)
Dr.Sethi this is a my chart message from the pt below. He was told to stop the plavix for one day until Ct scan results come in. Can you advise pt and give him and wife a call.He was discharge from hospital on 7/102019. The message below is from the pt:  ( I was told to stop taking the blood thinner for one day and did so. I feel energized today and have not taking the blood thinner but I'm concerned that I could have some clotting in my vessels should I not take the blood thinner. Is it better for me to resume taking the blood thinner or is it safe for me to wait until I come into your office? )

## 2018-02-14 NOTE — Telephone Encounter (Signed)
I spoke to the patient who informed me that he has restarted taking the Plavix after a few days and now is tolerating the medication well without any side effects. I advised him to continue aspirin and Plavix for a total of 3 weeks after his stroke and then to discontinue Plavix and started on aspirin alone. He was advised to keep scheduled follow-up appointment in the stroke clinic. He voiced understanding.

## 2018-02-14 NOTE — Therapy (Signed)
[facility] [address] [place], [place], [address] Phone: [phone]   Fax:  [phone]  Occupational Therapy Treatment  Patient Details  Name: Wayne Richards MRN: [id number] Date of Birth: 1956/02/15 Referring Provider: Dr. Lupe Carney   Encounter Date: 02/14/2018  OT End of Session - 02/14/18 1159    Visit Number  3    Number of Visits  25    Authorization Type  [employer]- 90 VL for all tree disciplines combined    OT Start Time  1100    OT Stop Time  1147    OT Time Calculation (min)  47 min    Activity Tolerance  Patient tolerated treatment well    Behavior During Therapy  [facility] for tasks assessed/performed       Past Medical History:  Diagnosis Date  . Asthma   . Stroke [facility])     Past Surgical History:  Procedure Laterality Date  . ACHILLES TENDON SURGERY      Vitals:   02/14/18 1149  BP: (!) 149/85    Subjective Assessment - 02/14/18 1149    Subjective   I am feeling better- seeing some progress    Pertinent History  hypertension, vertigo, hyperlipidemia- now with diagnosed subacute Left pons infarct, and acute right pons infarct    Currently in Pain?  No/denies    Pain Score  0-No pain                   OT Treatments/Exercises (OP) - 02/14/18 0001      ADLs   LB Dressing  Patient indicates he is dressing himself now - including tying shoes, donning pants.        Neurological Re-education Exercises   Other Exercises 1  Neuromuscular reeducation to address postural control and left upper extremity active movement.  Patient needed facilitation and cueing for mid to high reach to drop proximal shoulder (humeral head) to have distal end elevate for higher reach.  Worked on body on arm movement - gradually increasing weight through left upper extremity - patient able to follow facilitation for active trunk lat flex / extension over extended arm in sitting.  Worked to transition from  sidelying to sidesitting, with emphasis on proximal shoulder strengthening.  Patient able to obtain plank position - although slight weight shift toward right side.  Patient needs cueing for depression into surface to lift chest - activate shoulder girdle.  Patient with improved active wrist and digit motion - now able to oppose all but 5th digit to thumb.      Other Exercises 2  Transitional movements to address gross motor coordination - prone on elbows to quadruped to kneeling, to 1/2 kneeling - floor recovery - to determine if patient safe to complete exercises on floor at home.               OT Education - 02/14/18 1157    Education Details  biomechanics of mid to high reach (shoulder and trunk coordination)    Person(s) Educated  Patient    Methods  Explanation;Demonstration;Tactile cues;Verbal cues    Comprehension  Verbal cues required;Tactile cues required       OT Short Term Goals - 02/14/18 1202      OT SHORT TERM GOAL #1   Title  Patient will complete a HEP designed to improve coord in left hand independently due 03/10/18    Status  On-going      OT SHORT TERM GOAL #2  Title  Patient will complete a HEP designed to improve strength in left hand independently     Status  On-going      OT SHORT TERM GOAL #3   Title  Patient will dress lower body with no more than intermittent min assist    Status  On-going      OT SHORT TERM GOAL #4   Title  Patient will bathe himself in the shower with no more than intermittent min assist    Status  On-going      OT SHORT TERM GOAL #5   Title  Patient will prepare simple hot meal (using stove top or microwave) for he and his wife with modified independence    Status  On-going      OT SHORT TERM GOAL #6   Title  Patient will demonstrate improved coordiantion as evidenced by reduced time on 9 hole peg test by 30 seconds - to aide with buttoning, picking up and manipulating small objects, eg, medications    Status  On-going         OT Long Term Goals - 02/14/18 1203      OT LONG TERM GOAL #1   Title  Patient will complete updated HEP designed to improve range of motion and strength in left shoulder due 9/14.      Status  On-going      OT LONG TERM GOAL #2   Title  Patient will demonstrate sufficient stand balance and functional use of left arm to reach into high / low cabinets to obtain pots, pans, dishes as needed for cooking tasks    Status  On-going      OT LONG TERM GOAL #3   Title  Patient will be demonstrate sufficient left shoulder range of motion and sustained strength to don deodarant under each arm.      Status  On-going      OT LONG TERM GOAL #4   Title  Patient will demonstrate sufficient left hand strength and coordination for aide with simple tool use (patient is right hand dominant)     Status  On-going      OT LONG TERM GOAL #5   Title  Patient will demonstrate 15 lb increase in grip strength in left hand to aide with opening jars, bottles, etc.      Status  On-going            Plan - 02/14/18 1200    Clinical Impression Statement  Patient is showing good progress toward functional goals due to improved activity tolerance, improved functional use of left UE, and improved dynamic stand balance.      Occupational Profile and client history currently impacting functional performance  Husband, father of 64- youngest is 6119, retired Emergency planning/management officerpolice officer, Teacher, English as a foreign languagegolfer, Production designer, theatre/television/filmmanager of household    Occupational performance deficits (Please refer to evaluation for details):  ADL's;IADL's;Rest and Sleep;Work;Leisure    Rehab Potential  Excellent    Current Impairments/barriers affecting progress:  patient still under MD care to determine if stroke is stable - F/U CT scheduled this week.  F/U CT completed 7/18     OT Frequency  3x / week    OT Duration  8 weeks    OT Treatment/Interventions  Self-care/ADL training;Electrical Stimulation;Therapeutic exercise;Visual/perceptual remediation/compensation;Coping  strategies training;Aquatic Therapy;Neuromuscular education;Splinting;Patient/family education;Balance training;Therapeutic activities;Functional Mobility Training;Energy conservation;DME and/or AE instruction;Manual Therapy;Cognitive remediation/compensation    Plan  NMR LUE, HEP L shoulder consider ball ex in supine    Clinical Decision Making  Several treatment options, min-mod task modification necessary    Consulted and Agree with Plan of Care  Patient       Patient will benefit from skilled therapeutic intervention in order to improve the following deficits and impairments:  Decreased cognition, Decreased knowledge of use of DME, Impaired flexibility, Impaired vision/preception, Improper body mechanics, Impaired sensation, Decreased mobility, Decreased coordination, Cardiopulmonary status limiting activity, Decreased activity tolerance, Decreased endurance, Decreased range of motion, Decreased strength, Impaired tone, Decreased coping skills, Decreased balance, Difficulty walking, Impaired perceived functional ability, Impaired UE functional use  Visit Diagnosis: Muscle weakness (generalized)  Hemiplegia and hemiparesis following cerebral infarction affecting left non-dominant side (HCC)  Other lack of coordination  Other disturbances of skin sensation  Unsteadiness on feet  Ataxia    Problem List Patient Active Problem List   Diagnosis Date Noted  . Cerebral embolism with cerebral infarction 02/01/2018  . 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 Stroke-like symptoms   . Ataxia 01/31/2018  . Elevated blood pressure reading 01/31/2018  . Asthma     Collier Salina, OTR/L 02/14/2018, 12:04 PM  [facility] [address] [place], [place], [address] Phone: [phone]   Fax:  [phone]  Name: Gopal Malter MRN: [id number] Date of Birth: 04/14/1956

## 2018-02-15 NOTE — Therapy (Signed)
[facility] [address] [place], [place], [address] Phone: [phone]   Fax:  [phone]  Physical Therapy Treatment  Patient Details  Name: Wayne Richards MRN: [id number] Date of Birth: [birth year] Referring Provider: Dr. Donnie Coffin   Encounter Date: 02/14/2018  PT End of Session - 02/15/18 1214    Visit Number  2    Number of Visits  17    Date for PT Re-Evaluation  04/11/18    Authorization Type  Aet[employer] -  VL for PT/OT/ST combined: 90  If seen for all three visits on the same day it constitutes 3 visits    PT Start Time  1029    PT Stop Time  1100    PT Time Calculation (min)  31 min    Activity Tolerance  Patient tolerated treatment well    Behavior During Therapy  [facility] for tasks assessed/performed       Past Medical History:  Diagnosis Date  . Asthma   . Stroke [facility])     Past Surgical History:  Procedure Laterality Date  . ACHILLES TENDON SURGERY      There were no vitals filed for this visit.  Subjective Assessment - 02/14/18 1031    Subjective  Pt willing to see OT after PT today.  Had repeat CT scan with no new infarcts.  Still fatigued.  Frustrated with a few of the exercises.    Patient is accompained by:  -- wife accompanied pt but did not stay for evaluation    Pertinent History  ataxia; asthma: HTN: acute Rt pontine infarct:  subacute Lt pontine infarct     Diagnostic tests  CT scan scheduled for tomorrow    Patient Stated Goals  "Regain my normal function"    Currently in Pain?  No/denies         [facility] PT Assessment - 02/14/18 1033      Standardized Balance Assessment   Standardized Balance Assessment  Berg Balance Test      Berg Balance Test   Sit to Stand  Able to stand without using hands and stabilize independently    Standing Unsupported  Able to stand safely 2 minutes    Sitting with Back Unsupported but Feet Supported on Floor or Stool  Able to sit safely and securely 2  minutes    Stand to Sit  Sits safely with minimal use of hands    Transfers  Able to transfer safely, minor use of hands    Standing Unsupported with Eyes Closed  Able to stand 10 seconds safely    Standing Ubsupported with Feet Together  Able to place feet together independently and stand for 1 minute with supervision    From Standing, Reach Forward with Outstretched Arm  Can reach confidently >25 cm (10")    From Standing Position, Pick up Object from Floor  Able to pick up shoe safely and easily    From Standing Position, Turn to Look Behind Over each Shoulder  Looks behind from both sides and weight shifts well    Turn 360 Degrees  Able to turn 360 degrees safely but slowly    Standing Unsupported, Alternately Place Feet on Step/Stool  Able to complete 4 steps without aid or supervision    Standing Unsupported, One Foot in Front  Able to take small step independently and hold 30 seconds    Standing on One Leg  Able to lift leg independently and hold > 10 seconds  Total Score  49    Berg comment:  49/56        Patient demonstrates increased fall risk as noted by score of 49/56 on Berg Balance Scale.  (<36= high risk for falls, close to 100%; 37-45 significant >80%; 46-51 moderate >50%; 52-55 lower >25%)   STOP DOING SINGLE LEG BRIDGE FOR NOW; SWITCH TO:  Bracing With March in Bridging (Hook-Lying)    With neutral spine, tighten pelvic floor and abdominals and hold. Lift bottom and hold, then march in place. March _5__ times. Rest.  Do 5 more.    SINGLE LIMB STANCE    Hold counter or chair with one hand.  Stance: single leg on floor. Raise leg. Hold _10__ seconds. Repeat with other leg. __2-3_ reps per set  Balancing Act    In a standing position, go up on toes and down. Then back on heels. For balance, use support or put arms out in front. Repeat __10__ times.   Tandem Standing    Stand heel to toe holding chair or counter with one hand.  Let go and hold your  balance x 10 seconds.  Switch feet and repeat.  Perform 2-3 times each side.       PT Education - 02/15/18 1214    Education Details  BERG results and areas of impairment to address; adjustment of HEP    Person(s) Educated  Patient    Methods  Explanation;Demonstration;Handout    Comprehension  Verbalized understanding;Returned demonstration       PT Short Term Goals - 02/15/18 1225      PT SHORT TERM GOAL #1   Title  Improve Berg balance test score by >/= 5 points to reduce fall risk.  Will assess DGI to determine falls risk during gait    Baseline  49/56 - assess DGI at 4 weeks for long term goals    Time  4    Period  Weeks    Status  Revised    Target Date  03/11/18      PT SHORT TERM GOAL #2   Title  Improve TUG score from 28.44 secs to </= 23 secs with RW to demo improved mobility.    Baseline  28.44 secs wtih RW    Time  4    Period  Weeks    Status  New    Target Date  03/11/18      PT SHORT TERM GOAL #3   Title  Amb. 100' with SPC with CGA on flat, even surface.    Time  4    Period  Weeks    Status  New    Target Date  03/11/18      PT SHORT TERM GOAL #4   Title  Increase gait velocity from 1.11 ft/sec to >/= 1.5 ft/sec with RW for incr. gait efficiency.    Baseline  29.43 secs = 1.11 ft/sec    Time  4    Period  Weeks    Status  New    Target Date  03/11/18      PT SHORT TERM GOAL #5   Title  Independent in HEP for LLE strengthening.    Time  4    Period  Weeks    Status  New    Target Date  03/11/18        PT Long Term Goals - 02/15/18 1227      PT LONG TERM GOAL #1   Title  Pt will improve  DGI by 4 points to decrease falls risk during gait    Baseline  (to be assessed at 4 weeks if BERG goal is met)    Time  8    Period  Weeks    Status  Revised    Target Date  04/11/18      PT LONG TERM GOAL #2   Title  Improve TUG score from 28.44 secs to </= 18 secs with RW for incr. functional mobility.    Baseline  28.44 secs with RW    Time  8     Period  Weeks    Status  New    Target Date  04/11/18      PT LONG TERM GOAL #3   Title  Pt will be modified independent without device for household ambulation.    Time  8    Period  Weeks    Status  New    Target Date  04/11/18      PT LONG TERM GOAL #4   Title  Amb. 500' on even/uneven surfaces with SPC for increased community accessibility.      Time  8    Period  Weeks    Status  New    Target Date  04/11/18      PT LONG TERM GOAL #5   Title  Negotiate 4 steps with 1 rail using a step over step sequence.    Time  8    Period  Weeks    Status  New    Target Date  04/11/18            Plan - 02/15/18 1220    Clinical Impression Statement  Treatment session today focused on continued assessment of balance impairments and falls risk with BERG.  Pt demonstrates moderate falls risk and has greatest difficulty with pivoting and more narrow BOS.  Reviewed current HEP due to pt reporting difficulty with single leg bridge; reviewed effective initiation of gluteal mm during bridge and replaced single leg bridge with bridge with march.  Added standing balance exercises to HEP.  Pt tolerated well; will continue to address in order to progress towards LTG.    PT Treatment/Interventions  ADLs/Self Care Home Management;Aquatic Therapy;Functional mobility training;Stair training;Gait training;DME Instruction;Therapeutic activities;Therapeutic exercise;Balance training;Neuromuscular re-education;Patient/family education;Passive range of motion;Electrical Stimulation    PT Next Visit Plan  Continue to add standing balance to HEP based on BERG results.  NMR LLE.  Gait training with cane indoors and outdoors!  Need AFO?    Consulted and Agree with Plan of Care  Patient       Patient will benefit from skilled therapeutic intervention in order to improve the following deficits and impairments:  Abnormal gait, Decreased activity tolerance, Decreased balance, Decreased coordination, Decreased  range of motion, Decreased knowledge of use of DME, Decreased endurance, Decreased strength, Impaired vision/preception, Impaired tone, Impaired sensation, Impaired UE functional use  Visit Diagnosis: Muscle weakness (generalized)  Hemiplegia and hemiparesis following cerebral infarction affecting left non-dominant side (HCC)  Unsteadiness on feet  Other abnormalities of gait and mobility     Problem List Patient Active Problem List   Diagnosis Date Noted  . Cerebral embolism with cerebral infarction 02/01/2018  . Stroke (cerebrum) ([place]) 02/01/2018  . Stroke-like symptoms   . Ataxia 01/31/2018  . Elevated blood pressure reading 01/31/2018  . Asthma     Rico Junker, PT, DPT 02/15/18    12:32 PM    [facility]  [address] [place], [place], [address] Phone: [phone]   Fax:  [phone]  Name: Gwin Eagon MRN: [id number] Date of Birth: December 26, 1955

## 2018-02-16 ENCOUNTER — Encounter: Payer: Self-pay | Admitting: Occupational Therapy

## 2018-02-16 ENCOUNTER — Ambulatory Visit: Payer: Managed Care, Other (non HMO) | Admitting: Physical Therapy

## 2018-02-16 ENCOUNTER — Encounter: Payer: Self-pay | Admitting: Physical Therapy

## 2018-02-16 ENCOUNTER — Ambulatory Visit: Payer: Managed Care, Other (non HMO) | Admitting: Occupational Therapy

## 2018-02-16 ENCOUNTER — Other Ambulatory Visit: Payer: Managed Care, Other (non HMO)

## 2018-02-16 DIAGNOSIS — R270 Ataxia, unspecified: Secondary | ICD-10-CM

## 2018-02-16 DIAGNOSIS — I69354 Hemiplegia and hemiparesis following cerebral infarction affecting left non-dominant side: Secondary | ICD-10-CM

## 2018-02-16 DIAGNOSIS — R2689 Other abnormalities of gait and mobility: Secondary | ICD-10-CM

## 2018-02-16 DIAGNOSIS — R2681 Unsteadiness on feet: Secondary | ICD-10-CM

## 2018-02-16 DIAGNOSIS — M6281 Muscle weakness (generalized): Secondary | ICD-10-CM

## 2018-02-16 DIAGNOSIS — R278 Other lack of coordination: Secondary | ICD-10-CM

## 2018-02-16 DIAGNOSIS — R208 Other disturbances of skin sensation: Secondary | ICD-10-CM

## 2018-02-16 NOTE — Therapy (Signed)
[facility] [address] [place], [place], [address] Phone: [phone]   Fax:  [phone]  Occupational Therapy Treatment  Patient Details  Name: Wayne Richards MRN: [id number] Date of Birth: [birth year] Referring Provider: Dr. Lupe Carney   Encounter Date: 02/16/2018  OT End of Session - 02/16/18 1001    Visit Number  4    Number of Visits  25    Authorization Type  Aetna- 90 VL for all tree disciplines combined    OT Start Time  0846    OT Stop Time  0930    OT Time Calculation (min)  44 min    Activity Tolerance  Patient tolerated treatment well    Behavior During Therapy  [facility] for tasks assessed/performed       Past Medical History:  Diagnosis Date  . Asthma   . Stroke [facility])     Past Surgical History:  Procedure Laterality Date  . ACHILLES TENDON SURGERY      There were no vitals filed for this visit.  Subjective Assessment - 02/16/18 0849    Subjective   I am frustrated that my hips are weak.  I cannot pick my leg up.    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  No/denies    Pain Score  0-No pain                   OT Treatments/Exercises (OP) - 02/16/18 0001      ADLs   Driving  Patient reports that he drove on Monday.  Discussed graduated driving, and importance of clearing driving with MD.  Patient sees primary tomorrow.  Began discussion regarding graduated driving program.        Neurological Re-education Exercises   Other Exercises 1  Neuromuscular reeducation to address sustained muscle activity in left arm, trunk, and left hip.  sidelying to sidesit to address proximal strength and control - patient with tendency to press up using strong internal rotation componenet.  Did well with cue to orient elbow crease forward.  Quadruped to address shoulder stretch - child's  pose, and stretch to horiz adduction.  Followed with increasing demand to left UE/LE by unweighting right limbs one at a time.  Patient needs facilitation and cueing for optimal sustained muscle activation - press down through hand and lift chest.      Other Exercises 2  Worked on interlimb coordination, left UE.  Tossing and catching large light weight ball, transitioned to smaller juggling sized balls to address hand's response, accuracy, response time first in sitting, then in standing.  Worked on tossing and catching while walking - patient with limited active dorsiflexion, and weak left hip musculature.  Worked on postural control with standing and walking using arms functionally e.g. no walker.  Functional coordination - standing to retrieve ball from floor, retrieving change from pockets, etc.               OT Education - 02/16/18 1001    Education Details  discussing driving with MD - Seeking MD clearance to drive    Person(s) Educated  Patient    Methods  Explanation    Comprehension  Verbalized understanding       OT Short Term Goals - 02/14/18 1202      OT SHORT TERM GOAL #1   Title  Patient will complete a HEP designed  to improve coord in left hand independently due 03/10/18    Status  On-going      OT SHORT TERM GOAL #2   Title  Patient will complete a HEP designed to improve strength in left hand independently     Status  On-going      OT SHORT TERM GOAL #3   Title  Patient will dress lower body with no more than intermittent min assist    Status  On-going      OT SHORT TERM GOAL #4   Title  Patient will bathe himself in the shower with no more than intermittent min assist    Status  On-going      OT SHORT TERM GOAL #5   Title  Patient will prepare simple hot meal (using stove top or microwave) for he and his wife with modified independence    Status  On-going      OT SHORT TERM GOAL #6   Title  Patient will demonstrate improved coordiantion as evidenced by  reduced time on 9 hole peg test by 30 seconds - to aide with buttoning, picking up and manipulating small objects, eg, medications    Status  On-going        OT Long Term Goals - 02/14/18 1203      OT LONG TERM GOAL #1   Title  Patient will complete updated HEP designed to improve range of motion and strength in left shoulder due 9/14.      Status  On-going      OT LONG TERM GOAL #2   Title  Patient will demonstrate sufficient stand balance and functional use of left arm to reach into high / low cabinets to obtain pots, pans, dishes as needed for cooking tasks    Status  On-going      OT LONG TERM GOAL #3   Title  Patient will be demonstrate sufficient left shoulder range of motion and sustained strength to don deodarant under each arm.      Status  On-going      OT LONG TERM GOAL #4   Title  Patient will demonstrate sufficient left hand strength and coordination for aide with simple tool use (patient is right hand dominant)     Status  On-going      OT LONG TERM GOAL #5   Title  Patient will demonstrate 15 lb increase in grip strength in left hand to aide with opening jars, bottles, etc.      Status  On-going            Plan - 02/16/18 1001    Clinical Impression Statement  Patient continues to show excellent progress in terms of postural control, balance, left arm movement/ functional strength and coordination and activity tolerance.  Left leg is progressing slighlty slower than left arm at this time.      Occupational Profile and client history currently impacting functional performance  Husband, father of 35- youngest is 24, retired Emergency planning/management officer, Teacher, English as a foreign language, Production designer, theatre/television/film of household    Occupational performance deficits (Please refer to evaluation for details):  ADL's;IADL's;Rest and Sleep;Work;Leisure    Rehab Potential  Excellent    OT Frequency  3x / week    OT Duration  8 weeks    OT Treatment/Interventions  Self-care/ADL training;Electrical Stimulation;Therapeutic  exercise;Visual/perceptual remediation/compensation;Coping strategies training;Aquatic Therapy;Neuromuscular education;Splinting;Patient/family education;Balance training;Therapeutic activities;Functional Mobility Training;Energy conservation;DME and/or AE instruction;Manual Therapy;Cognitive remediation/compensation    Plan  NMR LUE, HEP L shoulder    Clinical Decision  Making  Several treatment options, min-mod task modification necessary    Recommended Other Services  ?Neuropsych    Consulted and Agree with Plan of Care  Patient       Patient will benefit from skilled therapeutic intervention in order to improve the following deficits and impairments:  Decreased cognition, Decreased knowledge of use of DME, Impaired flexibility, Impaired vision/preception, Improper body mechanics, Impaired sensation, Decreased mobility, Decreased coordination, Cardiopulmonary status limiting activity, Decreased activity tolerance, Decreased endurance, Decreased range of motion, Decreased strength, Impaired tone, Decreased coping skills, Decreased balance, Difficulty walking, Impaired perceived functional ability, Impaired UE functional use  Visit Diagnosis: Hemiplegia and hemiparesis following cerebral infarction affecting left non-dominant side (HCC)  Unsteadiness on feet  Other lack of coordination  Muscle weakness (generalized)  Other disturbances of skin sensation  Ataxia    Problem List Patient Active Problem List   Diagnosis Date Noted  . Cerebral embolism with cerebral infarction 02/01/2018  . 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 Stroke-like symptoms   . Ataxia 01/31/2018  . Elevated blood pressure reading 01/31/2018  . Asthma     Collier SalinaGellert, Lakethia Coppess M, OTR/L 02/16/2018, 10:07 AM  [facility] [address] [place], [place], [address] Phone: [phone]   Fax:  [phone]  Name: Nilda SimmerFrank Richards MRN: [id number] Date of Birth:  1955/12/25

## 2018-02-16 NOTE — Therapy (Signed)
[facility] [address] [place], [place], [address] Phone: [phone]   Fax:  [phone]  Physical Therapy Treatment  Patient Details  Name: Wayne Richards MRN: [id number] Date of Birth: 08-03-1955 Referring Provider: Dr. Donnie Coffin   Encounter Date: 02/16/2018  PT End of Session - 02/16/18 0807    Visit Number  3    Number of Visits  17    Date for PT Re-Evaluation  04/11/18    Authorization Type  [employer] -  VL for PT/OT/ST combined: 90  If seen for all three visits on the same day it constitutes 3 visits    Authorization - Number of Visits  --    PT Start Time  0803    PT Stop Time  0845    PT Time Calculation (min)  42 min    Equipment Utilized During Treatment  Gait belt    Activity Tolerance  Patient tolerated treatment well;Patient limited by fatigue    Behavior During Therapy  [facility] for tasks assessed/performed       Past Medical History:  Diagnosis Date  . Asthma   . Stroke [facility])     Past Surgical History:  Procedure Laterality Date  . ACHILLES TENDON SURGERY      There were no vitals filed for this visit.  Subjective Assessment - 02/16/18 0806    Subjective  No new compliants. Updated exercises are going okay, has some questions about them. Is scheduled to see OT today as well. No falls ro pain to report.     Patient is accompained by:  Family member spouse in lobby    Pertinent History  ataxia; asthma: HTN: acute Rt pontine infarct:  subacute Lt pontine infarct     Diagnostic tests  CT scan scheduled for tomorrow    Patient Stated Goals  "Regain my normal function"    Currently in Pain?  No/denies    Multiple Pain Sites  No         [facility] Adult PT Treatment/Exercise - 02/16/18 3016      Neuro Re-ed    Neuro Re-ed Details   Neuro Re-Ed: Bridging Single Leg- x10 reps bil, Independent, limited by fatigue, VCs to keep breathing; Heels/Toes- x10 reps with counter support, supervision-min guard  VCs to stand up tall and not lean on his arms; Tandem Stance- x2 reps, 30 sec hold, with counter support, min-guard due to increased postural sway; SLS- x10 sec hold bil, min guard on Right, but min Assist on Left due to muscle weakness and inability of pt to hold Right leg off the ground and maintain balance; Corner Balance- on floor: wide BOS- EO 30 sec, supervision, EO 30 sec head turns side <> side supervision, EC 30 sec min guard due to increased postural sway, EC 30 sec head turns side <> side min guard due to increased postural sway but self correction by pt., EC 30 sec head turns up <> down min guard due to increased postural sway but self correction by pt.; On pillow: Narrow BOS- EC 30 sec, EC 30 sec with head turns side <> side, EC 30 sec with head turns up <> down all were min guard due to increased postural sway with self correction by patient, semi-tandem 30 sec bil 1 rep, min guard- min assist due to significant increase in postural sway and mostly self correction by patient with a little assistance from SPTA.; Stationary March- with counter support x10 reps bil, 3 sets, min guard-  min assist as patient fatigued; Step Taps at counter- x 10 reps bil, 1 set, min guard- min assist as patient fatigued; Gait- approx. 130 ft. without AD per pt. request, min guard-min Assist due to fatigue, but no LOB, on track to loosen muscles following strengthening and balance exercises and ended with RW for a quarter of a lap to the mat.         PT Short Term Goals - 02/15/18 1225      PT SHORT TERM GOAL #1   Title  Improve Berg balance test score by >/= 5 points to reduce fall risk.  Will assess DGI to determine falls risk during gait    Baseline  49/56 - assess DGI at 4 weeks for long term goals    Time  4    Period  Weeks    Status  Revised    Target Date  03/11/18      PT SHORT TERM GOAL #2   Title  Improve TUG score from 28.44 secs to </= 23 secs with RW to demo improved mobility.    Baseline   28.44 secs wtih RW    Time  4    Period  Weeks    Status  New    Target Date  03/11/18      PT SHORT TERM GOAL #3   Title  Amb. 100' with SPC with CGA on flat, even surface.    Time  4    Period  Weeks    Status  New    Target Date  03/11/18      PT SHORT TERM GOAL #4   Title  Increase gait velocity from 1.11 ft/sec to >/= 1.5 ft/sec with RW for incr. gait efficiency.    Baseline  29.43 secs = 1.11 ft/sec    Time  4    Period  Weeks    Status  New    Target Date  03/11/18      PT SHORT TERM GOAL #5   Title  Independent in HEP for LLE strengthening.    Time  4    Period  Weeks    Status  New    Target Date  03/11/18        PT Long Term Goals - 02/15/18 1227      PT LONG TERM GOAL #1   Title  Pt will improve DGI by 4 points to decrease falls risk during gait    Baseline  (to be assessed at 4 weeks if BERG goal is met)    Time  8    Period  Weeks    Status  Revised    Target Date  04/11/18      PT LONG TERM GOAL #2   Title  Improve TUG score from 28.44 secs to </= 18 secs with RW for incr. functional mobility.    Baseline  28.44 secs with RW    Time  8    Period  Weeks    Status  New    Target Date  04/11/18      PT LONG TERM GOAL #3   Title  Pt will be modified independent without device for household ambulation.    Time  8    Period  Weeks    Status  New    Target Date  04/11/18      PT LONG TERM GOAL #4   Title  Amb. 500' on even/uneven surfaces with SPC for increased community accessibility.  Time  8    Period  Weeks    Status  New    Target Date  04/11/18      PT LONG TERM GOAL #5   Title  Negotiate 4 steps with 1 rail using a step over step sequence.    Time  8    Period  Weeks    Status  New    Target Date  04/11/18        Plan - 02/16/18 0850    Clinical Impression Statement  Today's session focused on reviewing HEP and working on corner balance and LE strengthening exercises. Pt. demonstrated HEP exercises well and only had noted  difficulty with SLS on his LLE. Pt. did well with corner balance and was challenged with compliant surface but was able to self-correct when postural sway increased. Pt. was limited by fatigue and needed multiple rest breaks as LE's got increasingly tired as noted by visable tremors with activity. Pt. wanted to keep pushing through fatigue however for safety reasons SPTA had pt. rest to allow muscles to recover before continuing. At end of session, pt. had a small breakdown due to emotions and frustration that he cannot get better more quickly. SPTA spent time calming him and explaining that everything that he is doing already is designed to get him where he wants to be however  recovery is a process and that every therapist he works with is here to help him get better. SPTA offered information about the Stroke Support Group, but pt. reported that he already has it and plans to go when it starts up again. He may benefit from speaking to someone more skilled to assist him with dealing with his emotions and life changes after his stroke, ? neuropsych. Will discuss with primary PT and rest of his care team if these emotions continue to present with sessions. Pt is progressing toward goals and should benefit from continued PT to progress toward unmet goals.     PT Treatment/Interventions  ADLs/Self Care Home Management;Aquatic Therapy;Functional mobility training;Stair training;Gait training;DME Instruction;Therapeutic activities;Therapeutic exercise;Balance training;Neuromuscular re-education;Patient/family education;Passive range of motion;Electrical Stimulation    PT Next Visit Plan  Continue to add standing balance to HEP based on BERG results.  NMR LLE. Balance on compliant surfaces. Gait training with cane indoors and outdoors!  Need AFO?    Consulted and Agree with Plan of Care  Patient         Patient will benefit from skilled therapeutic intervention in order to improve the following deficits and  impairments:  Abnormal gait, Decreased activity tolerance, Decreased balance, Decreased coordination, Decreased range of motion, Decreased knowledge of use of DME, Decreased endurance, Decreased strength, Impaired vision/preception, Impaired tone, Impaired sensation, Impaired UE functional use  Visit Diagnosis: Muscle weakness (generalized)  Hemiplegia and hemiparesis following cerebral infarction affecting left non-dominant side (HCC)  Unsteadiness on feet  Other abnormalities of gait and mobility     Problem List Patient Active Problem List   Diagnosis Date Noted  . Cerebral embolism with cerebral infarction 02/01/2018  . Stroke (cerebrum) ([place]) 02/01/2018  . Stroke-like symptoms   . Ataxia 01/31/2018  . Elevated blood pressure reading 01/31/2018  . Asthma     Arthor Captain, [place] 02/16/2018, 9:23 AM  [facility] [address] [place], [place], [address] Phone: [phone]   Fax:  [phone]  Name: Angeles Paolucci MRN: [id number] Date of Birth: 1956-05-18

## 2018-02-18 NOTE — Progress Notes (Signed)
[facility] [address] [place] [id number] [phone]       OFFICE FOLLOW UP NOTE  Mr. Wayne Richards Date of Birth:  Oct 18, 1955 Medical Record Number:  [id number]   Reason for Referral:  hospital stroke follow up  CHIEF COMPLAINT:  Chief Complaint  Patient presents with  . Follow-up    Stroke follow up room 9 pt was seen in hospital by Dr.Sethi pt with wife    HPI: Wayne Richards is being seen today for initial visit in the office for acute right pontine infarct on 01/31/2018. History obtained from patient and chart review. Reviewed all radiology images and labs personally.  Wayne Richards is a 62 y.o. male with history of vertigo, HTN and HLD who presented after 1 week of incoordination of L side along with some numbness in left hand.  CT head reviewed and was negative for acute stroke.  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  MRA neck showed patent carotid and vertebral arteries without hemodynamically significant stenosis.  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  2D echo within normal EF and negative for PFO.  LDL 150 and as patient was not on statin PTA is recommended to start Lipitor 80 mg daily.  A1c satisfactory at 5.8.  Patient was not on antithrombotic PTA and recommended DAPT with aspirin and Plavix for 3 weeks and then aspirin alone as it was believed etiology likely from small vessel disease.  Patient was discharged home with recommendations of continued PT/OT.  Patient is being seen today for hospital follow-up and is accompanied by his wife.  [facility] session due to a nurse from the insurance company advised him that he could have a new brain hemorrhage due to possible increase in left-sided weakness and facial droop but per wife, she did notice the  symptoms in the hospital but did not report them to staff due to her believing they are already aware.  PCP did order CT scan which was completed on 02/10/2018 which was negative for acute infarct or hemorrhage.  Patient does continue to have residual left-sided weakness but states this has been improving.  Patient continues to attend PT/OT at our neuro rehab clinic and has been making daily improvements.  He does state that he notices mild increase in weakness when he is fatigued or with increased stress.  Patient continues to take both aspirin and Plavix without side effects of bleeding or bruising.  Continues to take Lipitor with possible myalgias but patient is able to tolerate at this time and feels as though these pains have been improving.  Blood pressure mildly elevated at 142/87 and his patient does monitor this at home, he states it is typically lower.  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  Patient was educated on post stroke depression and was advised that this is something that can improve over time or this could be a lifelong issue that he may have to be treated for.  Patient would rather hold off at this time on starting any depression medications and continue to monitor.  Long discussion regarding small vessel disease and stroke risk factor modification and management.  Denies new or worsening stroke/TIA symptoms.   ROS:  14 system review of systems performed and negative with exception of fatigue, joint pain, aching muscles, memory loss, weakness, insomnia, decreased energy and disinterest in activities   PMH:  Past Medical History:  Diagnosis Date  . Asthma   . Stroke [facility])     PSH:  Past Surgical History:  Procedure Laterality Date  . ACHILLES TENDON SURGERY      Social History:  Social History   Socioeconomic History  . Marital status: Married    Spouse name: Not on  file  . Number of children: Not on file  . Years of education: Not on file  . Highest education level: Not on file  Occupational History  . Not on file  Social Needs  . Financial resource strain: Not on file  . Food insecurity:    Worry: Not on file    Inability: Not on file  . Transportation needs:    Medical: Not on file    Non-medical: Not on file  Tobacco Use  . Smoking status: Never Smoker  . Smokeless tobacco: Never Used  Substance and Sexual Activity  . Alcohol use: Yes    Frequency: Never    Comment: SOCIAL  . Drug use: Never  . Sexual activity: Not on file  Lifestyle  . Physical activity:    Days per week: Not on file    Minutes per session: Not on file  . Stress: Not on file  Relationships  . Social connections:    Talks on phone: Not on file    Gets together: Not on file    Attends religious service: Not on file    Active member of club or organization: Not on file    Attends meetings of clubs or organizations: Not on file    Relationship status: Not on file  . Intimate partner violence:    Fear of current or ex partner: Not on file    Emotionally abused: Not on file    Physically abused: Not on file    Forced sexual activity: Not on file  Other Topics Concern  . Not on file  Social History Narrative  . Not on file    Family History:  Family History  Problem Relation Age of Onset  . Hypertension Father   . Stroke Father   . Cardiomyopathy Brother     Medications:   Current Outpatient Medications on File Prior to Visit  Medication Sig Dispense Refill  . aspirin EC 81 MG EC tablet Take 1 tablet (81 mg total) by mouth daily. 30 tablet 1  . atorvastatin (LIPITOR) 80 MG tablet Take 1 tablet (80 mg total) by mouth daily at 6 PM. 30 tablet 2  . clopidogrel (PLAVIX) 75 MG tablet Take 1 tablet (75 mg total) by mouth daily. 30 tablet 2  . famotidine (PEPCID) 20 MG tablet Take 1 tablet (20 mg total) by mouth 2 (two) times daily as needed (with Ibuprofen).  60 tablet 0  . fexofenadine-pseudoephedrine (ALLEGRA-D) 60-120 MG 12 hr tablet Take 1 tablet by mouth 2 (two) times daily as needed (allergies).    . salmeterol (SEREVENT DISKUS) 50 MCG/DOSE diskus inhaler Inhale 1 puff into the lungs 2 (two) times daily.     No current facility-administered medications on file prior to visit.     Allergies:   Allergies  Allergen Reactions  . Iodine Hives, Itching, Swelling and Rash  . Peanut-Containing Drug Products Hives, Itching and Swelling  . Shellfish Allergy Hives, Itching and Swelling  . Penicillins  Hives, Swelling and Rash     Physical Exam  Vitals:   02/21/18 0847  BP: 137/87  Pulse: 70  Weight: 245 lb 9.6 oz (111.4 kg)  Height: 6\' 1"  (1.854 m)   Body mass index is 32.4 kg/m. No exam data present  General: well developed, well nourished, pleasant middle-aged African-American male, seated, in no evident distress Head: head normocephalic and atraumatic.   Neck: supple with no carotid or supraclavicular bruits Cardiovascular: regular rate and rhythm, no murmurs Musculoskeletal: no deformity Skin:  no rash/petichiae Vascular:  Normal pulses all extremities  Neurologic Exam Mental Status: Awake and fully alert. Oriented to place and time. Recent and remote memory intact. Attention span, concentration and fund of knowledge appropriate. Mood and affect appropriate.  Cranial Nerves: Fundoscopic exam reveals sharp disc margins. Pupils equal, briskly reactive to light. Extraocular movements full without nystagmus. Visual fields full to confrontation. Hearing intact. Facial sensation intact. Face, tongue, palate moves normally and symmetrically.  Motor: Normal bulk and tone.  3+/5 LLE and 4+/5 LUE and mild left ankle dorsiflexion weakness; right side 5/5 Sensory.: intact to touch , pinprick , position and vibratory sensation.  Coordination: Rapid alternating movements normal on right side. Finger-to-nose and heel-to-shin performed accurately  on right side.  Orbits right arm over left.  Decreased finger dexterity on left hand. Gait and Station: Arises from chair without difficulty. Stance is normal. Gait demonstrates hemiplegic gait pattern on left lower extremity with mild dragging of left foot.  Reflexes: 1+ and symmetric. Toes downgoing.    NIHSS  1 Modified Rankin  3   Diagnostic Data (Labs, Imaging, Testing)  CT head without contrast 01/31/2018 IMPRESSION: 1.  No evidence of acute intracranial abnormality. 2. Mild paranasal sinusitis of uncertain chronicity, probably chronic.  MRI brain without contrast 02/01/2018 IMPRESSION: Acute and subacute infarctions affecting the pons. 6 mm probably subacute infarction in the left side of the pons. Larger, more recent infarction in the right side of the pons, with fairly low level diffusion disturbance at this time. No evidence of swelling or hemorrhage.   MR MRA head without contrast MR MRA neck without contrast 02/01/2018 IMPRESSION: MRA neck: Patent carotid and vertebral arteries in the neck. No hemodynamically significant stenosis by NASCET criteria, occlusion, or aneurysm. 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 No additional large vessel occlusion of the intracranial circulation.    ASSESSMENT: Wayne Richards is a 62 y.o. 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 Vascular risk factors include HTN, HLD and DM.     PLAN: -Continue aspirin 81 mg daily and clopidogrel 75 mg daily  and Lipitor for secondary stroke prevention -Stop Plavix after 02/24/2018 and continue aspirin only for a total of 3-week DAPT -F/u with PCP regarding your HLD and HTN management -PCP to repeat lipid panel in 2 months per patient -Continue current therapies for continued left hemiparesis and was advised of additional therapy as needed, to contact office for additional orders -continue  to monitor BP at home -Advised to continue to stay active and eat a healthy diet -Patient and wife questioning if they can attend a trip to [place] at the end of August -they were advised that if patient continues to improve and do well without additional, new or worsening stroke/TIA symptoms and continues to tolerate all medications, patient should be safe to go on this trip as long as he is aware once  he lives in [place] of closest emergency room facility -patient and wife still debating on this trip due to patient's increased fatigue and if patient will be able to tolerate and it was advised to patient and wife that this will be their decision -Maintain strict control of hypertension with blood pressure goal below 130/90, diabetes with hemoglobin A1c goal below 6.5% and cholesterol with LDL cholesterol (bad cholesterol) goal below 70 mg/dL. I also advised the patient to eat a healthy diet with plenty of whole grains, cereals, fruits and vegetables, exercise regularly and maintain ideal body weight.  Follow up in 3 months or call earlier if needed   Greater than 50% of time during this 45 minute visit was spent on counseling,explanation of diagnosis of right pons infarct and subacute left pontine infarct, reviewing risk factor management of HLD and HTN, planning of further management, discussion with patient and family and coordination of care.   George Hugh, AGNP-BC  [facility] [address] [place], [place] [address]  Phone [phone] Fax [phone]

## 2018-02-21 ENCOUNTER — Encounter: Payer: Self-pay | Admitting: Physical Therapy

## 2018-02-21 ENCOUNTER — Ambulatory Visit: Payer: Managed Care, Other (non HMO) | Admitting: Adult Health

## 2018-02-21 ENCOUNTER — Ambulatory Visit: Payer: Managed Care, Other (non HMO) | Admitting: Physical Therapy

## 2018-02-21 ENCOUNTER — Encounter: Payer: Self-pay | Admitting: Adult Health

## 2018-02-21 VITALS — BP 137/87 | HR 70 | Ht 73.0 in | Wt 245.6 lb

## 2018-02-21 DIAGNOSIS — R2681 Unsteadiness on feet: Secondary | ICD-10-CM

## 2018-02-21 DIAGNOSIS — I10 Essential (primary) hypertension: Secondary | ICD-10-CM | POA: Diagnosis not present

## 2018-02-21 DIAGNOSIS — E7800 Pure hypercholesterolemia, unspecified: Secondary | ICD-10-CM

## 2018-02-21 DIAGNOSIS — R2689 Other abnormalities of gait and mobility: Secondary | ICD-10-CM

## 2018-02-21 DIAGNOSIS — I69354 Hemiplegia and hemiparesis following cerebral infarction affecting left non-dominant side: Secondary | ICD-10-CM

## 2018-02-21 DIAGNOSIS — M6281 Muscle weakness (generalized): Secondary | ICD-10-CM

## 2018-02-21 DIAGNOSIS — I63513 Cerebral infarction due to unspecified occlusion or stenosis of bilateral middle cerebral arteries: Secondary | ICD-10-CM

## 2018-02-21 NOTE — Patient Instructions (Signed)
Step: Up, Lateral    Stand with side toward step. Place involved leg up. Raise body using top leg only. Step off other side, involved leg first, lower body using other leg. Repeat __10__ times per set. Do __2__ sets per session. Repeat on other side; make sure you weight shift all the way over.  Copyright  VHI. All rights reserved.  Step: Up, Anterior    Stand facing step. Place involved leg up. Raise body using top leg only. Step down backward, involved leg first, lower body using other leg. Repeat __10__ times per set. Do __2__ sets per session. Repeat with other leg.    Feet Heel-Toe "Tandem"    Arms outstretched, walk a straight line bringing one foot directly in front of the other.  Go slow with good control. Repeat for 4 laps per session. Do __2__ sessions per day.  Copyright  VHI. All rights reserved.

## 2018-02-21 NOTE — Patient Instructions (Signed)
Continue aspirin 81 mg daily and clopidogrel 75 mg daily  and lipitor  for secondary stroke prevention  Stop plavix after 02/24/18 and continue aspirin only  Continue to follow up with PCP regarding cholesterol and blood pressure management   Continue therapies - if you need additional orders let me know and we can place them  Continue to monitor blood pressure at home  Maintain strict control of hypertension with blood pressure goal below 130/90, diabetes with hemoglobin A1c goal below 6.5% and cholesterol with LDL cholesterol (bad cholesterol) goal below 70 mg/dL. I also advised the patient to eat a healthy diet with plenty of whole grains, cereals, fruits and vegetables, exercise regularly and maintain ideal body weight.  Followup in the future with me in 3 months or call earlier if needed        Thank you for coming to see [facility] at [facility]. I hope we have been able to provide you high quality care today.  You may receive a patient satisfaction survey over the next few weeks. We would appreciate your feedback and comments so that we may continue to improve ourselves and the health of our patients.

## 2018-02-21 NOTE — Therapy (Signed)
[facility] [address] [place], [place], [address] Phone: [phone]   Fax:  [phone]  Physical Therapy Treatment  Patient Details  Name: Wayne Richards MRN: [id number] Date of Birth: [birth year] Referring Provider: Dr. Donnie Coffin   Encounter Date: 02/21/2018  PT End of Session - 02/21/18 1516    Visit Number  4    Number of Visits  17    Date for PT Re-Evaluation  04/11/18    Authorization Type  [employer] -  VL for PT/OT/ST combined: 90  If seen for all three visits on the same day it constitutes 3 visits    Authorization - Visit Number  9    Authorization - Number of Visits  90    PT Start Time  1020    PT Stop Time  1102    PT Time Calculation (min)  42 min    Equipment Utilized During Treatment  --    Activity Tolerance  Patient tolerated treatment well    Behavior During Therapy  [facility] for tasks assessed/performed       Past Medical History:  Diagnosis Date  . Asthma   . Stroke [facility])     Past Surgical History:  Procedure Laterality Date  . ACHILLES TENDON SURGERY      There were no vitals filed for this visit.  Subjective Assessment - 02/21/18 1022    Subjective  Had neurology appointment this morning.  Neurologist told patient his brain would work better in the am and would be best to have therapy in the morning.  Also told pt to ask when he would be appropriate to work with weights.  Having some hip pain after exercises and having R knee tendonitis pain after exercises.    Patient is accompained by:  Family member spouse in lobby    Pertinent History  ataxia; asthma: HTN: acute Rt pontine infarct:  subacute Lt pontine infarct     Diagnostic tests  CT scan scheduled for tomorrow    Patient Stated Goals  "Regain my normal function"    Currently in Pain?  No/denies joint/mm pain after exercise, gone next day                       [facility] Adult PT Treatment/Exercise - 02/21/18 1031       Ambulation/Gait   Ambulation/Gait  Yes    Ambulation/Gait Assistance  5: Supervision    Ambulation/Gait Assistance Details  pt chose not to use AD today because he feels he is improving; would still like to walk without a "limp"    Ambulation Distance (Feet)  115 Feet    Assistive device  None    Gait Pattern  Decreased stance time - left;Decreased hip/knee flexion - left;Decreased dorsiflexion - left;Decreased trunk rotation    Ambulation Surface  Level;Indoor      Therapeutic Activites    Therapeutic Activities  Other Therapeutic Activities    Other Therapeutic Activities  discussed fatigue as normal consequence of CVA and recovery and concept of energy conservation during the day to ensure pt does not over exert himself with therapy or other daily activities as his endurance and activity tolerance improves.  Also discussed rationale for increasing resistance for LE strengthening by adding weights - once pt able to perform exercise repetitions through full range against gravity.        Exercises   Exercises  Knee/Hip      Knee/Hip Exercises: Standing  Hip Abduction  Right;Left;1 set;10 reps;Knee straight    Abduction Limitations  closed chain hip dips    Lateral Step Up  Right;Left;1 set;10 reps;Hand Hold: 2;Step Height: 6"    Forward Step Up  Left;Hand Hold: 1;Hand Hold: 0;10 reps;2 sets;Step Height: 4";Step Height: 6"          Balance Exercises - 02/21/18 1102      Balance Exercises: Standing   Balance Beam  wooden beam; heel-toe forwards and retro x 4 laps, tandem gait forwards and backwards with heel taps off beam x 4 laps, side stepping across beam to L and R x 4 laps with min A for balance        PT Education - 02/21/18 1514    Education Details  see TA; step ups and tandem gait at home    Person(s) Educated  Patient    Methods  Explanation;Demonstration    Comprehension  Verbalized understanding;Returned demonstration       PT Short Term Goals - 02/15/18 1225       PT SHORT TERM GOAL #1   Title  Improve Berg balance test score by >/= 5 points to reduce fall risk.  Will assess DGI to determine falls risk during gait    Baseline  49/56 - assess DGI at 4 weeks for long term goals    Time  4    Period  Weeks    Status  Revised    Target Date  03/11/18      PT SHORT TERM GOAL #2   Title  Improve TUG score from 28.44 secs to </= 23 secs with RW to demo improved mobility.    Baseline  28.44 secs wtih RW    Time  4    Period  Weeks    Status  New    Target Date  03/11/18      PT SHORT TERM GOAL #3   Title  Amb. 100' with SPC with CGA on flat, even surface.    Time  4    Period  Weeks    Status  New    Target Date  03/11/18      PT SHORT TERM GOAL #4   Title  Increase gait velocity from 1.11 ft/sec to >/= 1.5 ft/sec with RW for incr. gait efficiency.    Baseline  29.43 secs = 1.11 ft/sec    Time  4    Period  Weeks    Status  New    Target Date  03/11/18      PT SHORT TERM GOAL #5   Title  Independent in HEP for LLE strengthening.    Time  4    Period  Weeks    Status  New    Target Date  03/11/18        PT Long Term Goals - 02/15/18 1227      PT LONG TERM GOAL #1   Title  Pt will improve DGI by 4 points to decrease falls risk during gait    Baseline  (to be assessed at 4 weeks if BERG goal is met)    Time  8    Period  Weeks    Status  Revised    Target Date  04/11/18      PT LONG TERM GOAL #2   Title  Improve TUG score from 28.44 secs to </= 18 secs with RW for incr. functional mobility.    Baseline  28.44 secs with RW  Time  8    Period  Weeks    Status  New    Target Date  04/11/18      PT LONG TERM GOAL #3   Title  Pt will be modified independent without device for household ambulation.    Time  8    Period  Weeks    Status  New    Target Date  04/11/18      PT LONG TERM GOAL #4   Title  Amb. 500' on even/uneven surfaces with SPC for increased community accessibility.      Time  8    Period  Weeks     Status  New    Target Date  04/11/18      PT LONG TERM GOAL #5   Title  Negotiate 4 steps with 1 rail using a step over step sequence.    Time  8    Period  Weeks    Status  New    Target Date  04/11/18            Plan - 02/21/18 1518    Clinical Impression Statement  Pt demonstrated good progress and recovery as indicated by pt attending therapy today without use of AD.  Continued to progress and address proximal hip stability and strengthening, weight shifting and balance training to improve SLS to improve safety of gait.  Pt tolerated well but demonstrated increased difficulty activating L glute med in closed chain activities; will continue to address in order to progress towards LTG.    PT Frequency  2x / week    PT Duration  8 weeks    PT Treatment/Interventions  ADLs/Self Care Home Management;Aquatic Therapy;Functional mobility training;Stair training;Gait training;DME Instruction;Therapeutic activities;Therapeutic exercise;Balance training;Neuromuscular re-education;Patient/family education;Passive range of motion;Electrical Stimulation    PT Next Visit Plan  Please give him handout of new exercises from 7/29.  Discuss neuropscych for emotional coping with pt. Gait without cane indoor and outdoor; incorporate golf into treatment, Continue to add standing balance to HEP based on BERG results.  NMR LLE. Balance on compliant surfaces.     Consulted and Agree with Plan of Care  Patient       Patient will benefit from skilled therapeutic intervention in order to improve the following deficits and impairments:  Abnormal gait, Decreased activity tolerance, Decreased balance, Decreased coordination, Decreased range of motion, Decreased knowledge of use of DME, Decreased endurance, Decreased strength, Impaired vision/preception, Impaired tone, Impaired sensation, Impaired UE functional use  Visit Diagnosis: Hemiplegia and hemiparesis following cerebral infarction affecting left  non-dominant side (HCC)  Other abnormalities of gait and mobility  Muscle weakness (generalized)  Unsteadiness on feet     Problem List Patient Active Problem List   Diagnosis Date Noted  . Cerebral embolism with cerebral infarction 02/01/2018  . Stroke (cerebrum) ([place]) 02/01/2018  . Stroke-like symptoms   . Ataxia 01/31/2018  . Elevated blood pressure reading 01/31/2018  . Asthma    Wayne Richards, PT, DPT 02/21/18    3:24 PM    [facility] [address] [place], [place], [address] Phone: [phone]   Fax:  [phone]  Name: Wayne Richards MRN: [id number] Date of Birth: 1955/11/04

## 2018-02-22 ENCOUNTER — Ambulatory Visit: Payer: Managed Care, Other (non HMO) | Admitting: Occupational Therapy

## 2018-02-22 ENCOUNTER — Encounter: Payer: Self-pay | Admitting: Occupational Therapy

## 2018-02-22 ENCOUNTER — Ambulatory Visit: Payer: Managed Care, Other (non HMO) | Admitting: Physical Therapy

## 2018-02-22 DIAGNOSIS — M6281 Muscle weakness (generalized): Secondary | ICD-10-CM

## 2018-02-22 DIAGNOSIS — I69354 Hemiplegia and hemiparesis following cerebral infarction affecting left non-dominant side: Secondary | ICD-10-CM | POA: Diagnosis not present

## 2018-02-22 DIAGNOSIS — R278 Other lack of coordination: Secondary | ICD-10-CM

## 2018-02-22 DIAGNOSIS — R2681 Unsteadiness on feet: Secondary | ICD-10-CM

## 2018-02-22 DIAGNOSIS — R208 Other disturbances of skin sensation: Secondary | ICD-10-CM

## 2018-02-22 NOTE — Therapy (Signed)
[facility] [address] [place], [place], [address] Phone: [phone]   Fax:  [phone]  Occupational Therapy Treatment  Patient Details  Name: Wayne Richards MRN: [id number] Date of Birth: 1955-11-01 Referring Provider: Dr. Donnie Coffin   Encounter Date: 02/22/2018  OT End of Session - 02/22/18 1240    Visit Number  5    Number of Visits  25    Authorization Type  [employer]- 90 VL for all tree disciplines combined    OT Start Time  1100    OT Stop Time  1146    OT Time Calculation (min)  46 min    Activity Tolerance  Patient tolerated treatment well    Behavior During Therapy  [facility] for tasks assessed/performed       Past Medical History:  Diagnosis Date  . Asthma   . Stroke [facility])     Past Surgical History:  Procedure Laterality Date  . ACHILLES TENDON SURGERY      There were no vitals filed for this visit.  Subjective Assessment - 02/22/18 1205    Subjective   Patient saw neurology yesterday and is feeling more encouraged.      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  No/denies    Pain Score  0-No pain                   OT Treatments/Exercises (OP) - 02/22/18 0001      ADLs   Grooming  Patient now demonstrates adequate strength, range of motion and coordination to don deoderant with either hand.      Functional Mobility  Patient arrived to therapy session alone (drove himself) without his walker.  Patient only using his walker when he is fatigued.      Cooking  Patient was able to prepare a casserole for his son this weekend,  He used bilateral upper extremities together to pour from stovetop pan into baking dish.  He used left hand primarilly to hold pan while right hand scraped.  He reports difficulty with pouring and sustained holding with left hand during this task.      ADL  Comments  Patient is showing rapid improvement, and has met all short term goals, and even some long term goals.  See goal section.        Neurological Re-education Exercises   Other Exercises 1  Neuromuscular reeducation to address overhead reach - using ground reaction force through left leg to reach overhead and outside base of support.  Patient with improved ability to sustain left arm actively overhead.  Patient needs cueing to maintain activity in left lower extremity.      Other Exercises 2  Worked on fine motor coordination.  Note significantly improved postural control, no drifting toward right, and improved awareness of proximal shoulder activity (reduction) for distal UE work.            Balance Exercises - 02/21/18 1102      Balance Exercises: Standing   Balance Beam  wooden beam; heel-toe forwards and retro x 4 laps, tandem gait forwards and backwards with heel taps off beam x 4 laps, side stepping across beam to L and R x 4 laps with min A for balance        OT Education - 02/22/18 1240    Education Details  reviewed goals - patient making exceptional  progress with strength left hand and coordiantion, as well as functional mobility.      Person(s) Educated  Patient    Methods  Explanation    Comprehension  Verbalized understanding       OT Short Term Goals - 02/22/18 1242      OT SHORT TERM GOAL #1   Title  Patient will complete a HEP designed to improve coord in left hand independently due 03/10/18    Status  Achieved      OT SHORT TERM GOAL #2   Title  Patient will complete a HEP designed to improve strength in left hand independently     Status  Achieved      OT SHORT TERM GOAL #3   Title  Patient will dress lower body with no more than intermittent min assist    Status  Achieved      OT SHORT TERM GOAL #4   Title  Patient will bathe himself in the shower with no more than intermittent min assist    Status  Achieved      OT SHORT TERM GOAL #5   Title   Patient will prepare simple hot meal (using stove top or microwave) for he and his wife with modified independence    Status  Achieved      OT SHORT TERM GOAL #6   Title  Patient will demonstrate improved coordiantion as evidenced by reduced time on 9 hole peg test by 30 seconds - to aide with buttoning, picking up and manipulating small objects, eg, medications    Status  Achieved        OT Long Term Goals - 02/22/18 1242      OT LONG TERM GOAL #1   Title  Patient will complete updated HEP designed to improve range of motion and strength in left shoulder due 9/14.      Status  On-going      OT LONG TERM GOAL #2   Title  Patient will demonstrate sufficient stand balance and functional use of left arm to reach into high / low cabinets to obtain pots, pans, dishes as needed for cooking tasks    Status  On-going      OT LONG TERM GOAL #3   Title  Patient will be demonstrate sufficient left shoulder range of motion and sustained strength to don deodarant under each arm.      Status  Achieved      OT LONG TERM GOAL #4   Title  Patient will demonstrate sufficient left hand strength and coordination for aide with simple tool use (patient is right hand dominant)     Status  On-going      OT LONG TERM GOAL #5   Title  Patient will demonstrate 15 lb increase in grip strength in left hand to aide with opening jars, bottles, etc.      Status  Achieved 70 lbs!            Plan - 02/22/18 1240    Clinical Impression Statement  Patient showing excellent progress and has met all short term goals.      Occupational Profile and client history currently impacting functional performance  Husband, father of 33- youngest is 41, retired Engineer, structural, [employer] cabin crew, Freight forwarder of household    Occupational performance deficits (Please refer to evaluation for details):  ADL's;IADL's;Rest and Sleep;Work;Leisure    Rehab Potential  Excellent    OT Frequency  3x / week    OT Duration  8 weeks    OT  Treatment/Interventions  Self-care/ADL training;Electrical Stimulation;Therapeutic exercise;Visual/perceptual remediation/compensation;Coping strategies training;Aquatic Therapy;Neuromuscular education;Splinting;Patient/family education;Balance training;Therapeutic activities;Functional Mobility Training;Energy conservation;DME and/or AE instruction;Manual Therapy;Cognitive remediation/compensation    Plan  NMR LUE, HEP L shoulder, gross motor coordination, functional use of LUE - cooking task (reaching low/high)    Clinical Decision Making  Several treatment options, min-mod task modification necessary    Consulted and Agree with Plan of Care  Patient       Patient will benefit from skilled therapeutic intervention in order to improve the following deficits and impairments:  Decreased cognition, Decreased knowledge of use of DME, Impaired flexibility, Impaired vision/preception, Improper body mechanics, Impaired sensation, Decreased mobility, Decreased coordination, Cardiopulmonary status limiting activity, Decreased activity tolerance, Decreased endurance, Decreased range of motion, Decreased strength, Impaired tone, Decreased coping skills, Decreased balance, Difficulty walking, Impaired perceived functional ability, Impaired UE functional use  Visit Diagnosis: Hemiplegia and hemiparesis following cerebral infarction affecting left non-dominant side (HCC)  Other lack of coordination  Muscle weakness (generalized)  Other disturbances of skin sensation  Unsteadiness on feet    Problem List Patient Active Problem List   Diagnosis Date Noted  . Cerebral embolism with cerebral infarction 02/01/2018  . Stroke (cerebrum) ([place]) 02/01/2018  . Stroke-like symptoms   . Ataxia 01/31/2018  . Elevated blood pressure reading 01/31/2018  . Asthma     Mariah Milling, OTR/L 02/22/2018, 12:44 PM  [facility] [address] [place], [place], [address] Phone: [phone]   Fax:  [phone]  Name: Chrsitopher Wik MRN: [id number] Date of Birth: 05/05/1956

## 2018-02-23 ENCOUNTER — Encounter: Payer: Self-pay | Admitting: Occupational Therapy

## 2018-02-23 ENCOUNTER — Ambulatory Visit: Payer: Managed Care, Other (non HMO) | Admitting: Occupational Therapy

## 2018-02-23 DIAGNOSIS — R278 Other lack of coordination: Secondary | ICD-10-CM

## 2018-02-23 DIAGNOSIS — M6281 Muscle weakness (generalized): Secondary | ICD-10-CM

## 2018-02-23 DIAGNOSIS — I69354 Hemiplegia and hemiparesis following cerebral infarction affecting left non-dominant side: Secondary | ICD-10-CM

## 2018-02-23 DIAGNOSIS — R208 Other disturbances of skin sensation: Secondary | ICD-10-CM

## 2018-02-23 DIAGNOSIS — R2681 Unsteadiness on feet: Secondary | ICD-10-CM

## 2018-02-23 NOTE — Therapy (Signed)
[facility] [address] [place], [place], [address] Phone: [phone]   Fax:  [phone]  Occupational Therapy Treatment  Patient Details  Name: Azavion Bouillon MRN: [id number] Date of Birth: 04/28/1956 Referring Provider: Dr. Lupe Carney   Encounter Date: 02/23/2018  OT End of Session - 02/23/18 1327    Visit Number  6    Number of Visits  25    Authorization Type  [employer]- 90 VL for all tree disciplines combined    OT Start Time  1015    OT Stop Time  1103    OT Time Calculation (min)  48 min    Activity Tolerance  Patient tolerated treatment well    Behavior During Therapy  [facility] for tasks assessed/performed       Past Medical History:  Diagnosis Date  . Asthma   . Stroke [facility])     Past Surgical History:  Procedure Laterality Date  . ACHILLES TENDON SURGERY      There were no vitals filed for this visit.  Subjective Assessment - 02/23/18 1317    Subjective   It's like I forgot how to walk    Pertinent History  hypertension, vertigo, hyperlipidemia- now with diagnosed subacute Left pons infarct, and acute right pons infarct    Currently in Pain?  No/denies    Pain Score  0-No pain                   OT Treatments/Exercises (OP) - 02/23/18 0001      ADLs   Leisure  Worked on golf swing.  Encouraged patient to try at home - not working at full speed.  May have better foot support/ control in actual golf shoes.  Encouraged patient to videotape swing, and study for feedback.        Neurological Re-education Exercises   Other Exercises 1  Neuromuscular reeducation to address functional mobility.  Patient walks with very stiff left leg in swing phase, and lacks full active dorsiflexion.  Ultimate goal is for patient to have adequate postural control, balance,strength , and coordination to functionally use bilateral upper extremities while walking.  Worked with patient on mid to late stance with  left leg behind body in preparation for swing phase. Patient needed cueing and facilitation to allow back leg to soften, flexing at knee, and rolling onto toes.  Patient's toes gripping the surface.  Worked on reduction of tension in forefoot, then toward swinging left leg.  Patient needed cueing and facilitation to reach left leg toward surface with heel of left foot.  Tended to land with knee flexion. Patient has limited stability with side to side weight shifts, especiallt with narrow base of support like walking.      Other Exercises 2  Worked on gross motor coordination - walking - with emphasis on cadence - step length, knee flexion prior to swing and heel strike - while tossing and catching a ball.  The greater the physical demand - the more leg stiffness balance deficits were apparent.  With facilitation to maintain upright trunk and continuous weight shift forward, patient able to coordinate tossing and catching with stepping.  Attempted use of foot up brace - and found brace helpful to keep toes from catching when patient had adequate knee release prior to swing.  Worked on components of golf swing to address posture, balance, and coordination of trunk and limbs for familiar motor task.  OT Education - 02/23/18 1326    Education Details  reviewed stance to stride - role of left leg, attempted use of foot up brace, worked on [employer] of golf swing    Person(s) Educated  Patient    Methods  Explanation;Demonstration;Tactile cues;Verbal cues    Comprehension  Verbalized understanding;Verbal cues required;Tactile cues required;Need further instruction;Returned demonstration       OT Short Term Goals - 02/22/18 1242      OT SHORT TERM GOAL #1   Title  Patient will complete a HEP designed to improve coord in left hand independently due 03/10/18    Status  Achieved      OT SHORT TERM GOAL #2   Title  Patient will complete a HEP designed to improve strength in left hand  independently     Status  Achieved      OT SHORT TERM GOAL #3   Title  Patient will dress lower body with no more than intermittent min assist    Status  Achieved      OT SHORT TERM GOAL #4   Title  Patient will bathe himself in the shower with no more than intermittent min assist    Status  Achieved      OT SHORT TERM GOAL #5   Title  Patient will prepare simple hot meal (using stove top or microwave) for he and his wife with modified independence    Status  Achieved      OT SHORT TERM GOAL #6   Title  Patient will demonstrate improved coordiantion as evidenced by reduced time on 9 hole peg test by 30 seconds - to aide with buttoning, picking up and manipulating small objects, eg, medications    Status  Achieved        OT Long Term Goals - 02/22/18 1242      OT LONG TERM GOAL #1   Title  Patient will complete updated HEP designed to improve range of motion and strength in left shoulder due 9/14.      Status  On-going      OT LONG TERM GOAL #2   Title  Patient will demonstrate sufficient stand balance and functional use of left arm to reach into high / low cabinets to obtain pots, pans, dishes as needed for cooking tasks    Status  On-going      OT LONG TERM GOAL #3   Title  Patient will be demonstrate sufficient left shoulder range of motion and sustained strength to don deodarant under each arm.      Status  Achieved      OT LONG TERM GOAL #4   Title  Patient will demonstrate sufficient left hand strength and coordination for aide with simple tool use (patient is right hand dominant)     Status  On-going      OT LONG TERM GOAL #5   Title  Patient will demonstrate 15 lb increase in grip strength in left hand to aide with opening jars, bottles, etc.      Status  Achieved 70 lbs!            Plan - 02/23/18 1328    Clinical Impression Statement  Patient continues to be motivated for functional improvement.  Patient continues to show excellent progress.  Patient  concerned regarding leg functioning as related to walking.      Occupational Profile and client history currently impacting functional performance  Husband, father of 844- youngest is 2419, retired Cabin crewpolice  officer, Teacher, English as a foreign language, Production designer, theatre/television/film of household    Occupational performance deficits (Please refer to evaluation for details):  ADL's;IADL's;Rest and Sleep;Work;Leisure    Rehab Potential  Excellent    OT Frequency  3x / week    OT Duration  8 weeks    OT Treatment/Interventions  Self-care/ADL training;Electrical Stimulation;Therapeutic exercise;Visual/perceptual remediation/compensation;Coping strategies training;Aquatic Therapy;Neuromuscular education;Splinting;Patient/family education;Balance training;Therapeutic activities;Functional Mobility Training;Energy conservation;DME and/or AE instruction;Manual Therapy;Cognitive remediation/compensation    Plan  NMR LUE - STRENGTHENING - plank to tall plank, push up if able, coordiantion gross/fine motor, functional use of LUE       Patient will benefit from skilled therapeutic intervention in order to improve the following deficits and impairments:  Decreased cognition, Decreased knowledge of use of DME, Impaired flexibility, Impaired vision/preception, Improper body mechanics, Impaired sensation, Decreased mobility, Decreased coordination, Cardiopulmonary status limiting activity, Decreased activity tolerance, Decreased endurance, Decreased range of motion, Decreased strength, Impaired tone, Decreased coping skills, Decreased balance, Difficulty walking, Impaired perceived functional ability, Impaired UE functional use  Visit Diagnosis: Hemiplegia and hemiparesis following cerebral infarction affecting left non-dominant side (HCC)  Muscle weakness (generalized)  Unsteadiness on feet  Other disturbances of skin sensation  Other lack of coordination    Problem List Patient Active Problem List   Diagnosis Date Noted  . Cerebral embolism with cerebral  infarction 02/01/2018  . 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 Stroke-like symptoms   . Ataxia 01/31/2018  . Elevated blood pressure reading 01/31/2018  . Asthma     Collier Salina, OTR/L 02/23/2018, 1:33 PM  [facility] [address] [place], [place], [address] Phone: [phone]   Fax:  [phone]  Name: Kowen Kluth MRN: [id number] Date of Birth: [birth year]

## 2018-02-24 ENCOUNTER — Encounter: Payer: Self-pay | Admitting: Occupational Therapy

## 2018-02-24 ENCOUNTER — Ambulatory Visit: Payer: Managed Care, Other (non HMO) | Admitting: Physical Therapy

## 2018-02-24 ENCOUNTER — Encounter: Payer: Managed Care, Other (non HMO) | Admitting: Occupational Therapy

## 2018-02-24 ENCOUNTER — Ambulatory Visit: Payer: Managed Care, Other (non HMO) | Attending: Internal Medicine | Admitting: Occupational Therapy

## 2018-02-24 DIAGNOSIS — I69315 Cognitive social or emotional deficit following cerebral infarction: Secondary | ICD-10-CM | POA: Diagnosis present

## 2018-02-24 DIAGNOSIS — I69354 Hemiplegia and hemiparesis following cerebral infarction affecting left non-dominant side: Secondary | ICD-10-CM | POA: Diagnosis present

## 2018-02-24 DIAGNOSIS — M6281 Muscle weakness (generalized): Secondary | ICD-10-CM | POA: Diagnosis present

## 2018-02-24 DIAGNOSIS — R270 Ataxia, unspecified: Secondary | ICD-10-CM | POA: Diagnosis present

## 2018-02-24 DIAGNOSIS — R208 Other disturbances of skin sensation: Secondary | ICD-10-CM | POA: Insufficient documentation

## 2018-02-24 DIAGNOSIS — R278 Other lack of coordination: Secondary | ICD-10-CM | POA: Insufficient documentation

## 2018-02-24 DIAGNOSIS — R2681 Unsteadiness on feet: Secondary | ICD-10-CM | POA: Diagnosis present

## 2018-02-24 DIAGNOSIS — R2689 Other abnormalities of gait and mobility: Secondary | ICD-10-CM | POA: Diagnosis present

## 2018-02-24 NOTE — Patient Instructions (Signed)
Push-Up    With toes on ground, feet together, hands shoulder-width apart, and chest on floor, push up by straightening arms. Then lower body slowly to start position. Repeat 3 times  Do1 sessions per day. (Work up to 5 push ups)   http://cc.exer.us/65   Copyright  VHI. All rights reserved.   Plank   Ignore picture with leg up.   Support body on hands and feet. Keep hips in line with torso and arms straight under chest. Avoid locking elbows. ADVANCED:  Transition from forearm plank to long arm plank - one arm at a time - focus on weight shift to unweight during each transition.  As much emphasis on landing down with control as pressing up.   Do 1x/day  Copyright  VHI. All rights reserved.   Do the following 3x/week  Theraband -  Shoulder ABDuction.   Wrap band around each hand. Hold arms at waist height with elbows straight.  We practiced with palms facing down, could also do palms facing inward.   Press hands away from each other as far as possible.  Hold end position for 3 count, return to starting position with control.  10x rest repeat  Chest press:   With band wrapped around hands - hold right hand over heart - and "punch" left arm forward - extending the elbow.   5x rest repeat.  Avoid if increased wrist motion present.    Tricep dip: Seated in sturdy chair with arms.  Placing palms on arms of chair . Press up extending elbows to lift bottom from chair, then lower below seat of chair - elbows at shoulder height.   10 x rest repeat.  Wrist strengthening: 2 lb weight (or water bottle)  Rest arm over edge of table so hand hangs over edge, holding weight in hand.   With palm facing down extend wrist 10x rest repeat With palm facing upward flex wrist 10x rest repeat With palm facing inward Lift and lower weight 10x rest repeat

## 2018-02-24 NOTE — Therapy (Signed)
[facility] [address] [place], [place], [address] Phone: [phone]   Fax:  [phone]  Occupational Therapy Treatment  Patient Details  Name: Wayne SimmerFrank Hellmer MRN: [id number] Date of Birth: [birth year] Referring Provider: Dr. Lupe Carneyean Mitchell   Encounter Date: 02/24/2018  OT End of Session - 02/24/18 1217    Visit Number  7    Number of Visits  25    Authorization Type  [employer]- 90 VL for all tree disciplines combined    OT Start Time  1100    OT Stop Time  1145    OT Time Calculation (min)  45 min    Activity Tolerance  Patient tolerated treatment well    Behavior During Therapy  [facility] for tasks assessed/performed       Past Medical History:  Diagnosis Date  . Asthma   . Stroke [facility](HCC)     Past Surgical History:  Procedure Laterality Date  . ACHILLES TENDON SURGERY      There were no vitals filed for this visit.  Subjective Assessment - 02/23/18 1317    Subjective   It's like I forgot how to walk    Pertinent History  hypertension, vertigo, hyperlipidemia- now with diagnosed subacute Left pons infarct, and acute right pons infarct    Currently in Pain?  No/denies    Pain Score  0-No pain                   OT Treatments/Exercises (OP) - 02/24/18 0001      Neurological Re-education Exercises   Other Exercises 1  Established strengthening exercise program for Upper extremities - see patient instruction             OT Education - 02/24/18 1216    Education Details  HEP shoulder, elbow and wrist strengthening    Person(s) Educated  Patient    Methods  Explanation;Demonstration;Tactile cues;Verbal cues    Comprehension  Verbalized understanding;Returned demonstration;Verbal cues required;Need further instruction       OT Short Term Goals - 02/24/18 1218      OT SHORT TERM GOAL #1   Title  Patient will complete a HEP designed to improve coord in left hand independently due 03/10/18    Status  Achieved      OT SHORT TERM GOAL #2   Title  Patient will complete a HEP designed to improve strength in left hand independently     Status  Achieved      OT SHORT TERM GOAL #3   Title  Patient will dress lower body with no more than intermittent min assist    Status  Achieved      OT SHORT TERM GOAL #4   Title  Patient will bathe himself in the shower with no more than intermittent min assist    Status  Achieved      OT SHORT TERM GOAL #5   Title  Patient will prepare simple hot meal (using stove top or microwave) for he and his wife with modified independence    Status  Achieved      OT SHORT TERM GOAL #6   Title  Patient will demonstrate improved coordiantion as evidenced by reduced time on 9 hole peg test by 30 seconds - to aide with buttoning, picking up and manipulating small objects, eg, medications    Status  Achieved        OT Long Term Goals - 02/22/18 1242      OT  LONG TERM GOAL #1   Title  Patient will complete updated HEP designed to improve range of motion and strength in left shoulder due 9/14.      Status  On-going      OT LONG TERM GOAL #2   Title  Patient will demonstrate sufficient stand balance and functional use of left arm to reach into high / low cabinets to obtain pots, pans, dishes as needed for cooking tasks    Status  On-going      OT LONG TERM GOAL #3   Title  Patient will be demonstrate sufficient left shoulder range of motion and sustained strength to don deodarant under each arm.      Status  Achieved      OT LONG TERM GOAL #4   Title  Patient will demonstrate sufficient left hand strength and coordination for aide with simple tool use (patient is right hand dominant)     Status  On-going      OT LONG TERM GOAL #5   Title  Patient will demonstrate 15 lb increase in grip strength in left hand to aide with opening jars, bottles, etc.      Status  Achieved 70 lbs!            Plan - 02/24/18 1217    Clinical Impression  Statement  Patient showing improved functional mobility, and improved Left UE coordiantion and strength    Occupational Profile and client history currently impacting functional performance  Husband, father of 23- youngest is 41, retired Emergency planning/management officer, Teacher, English as a foreign language, Production designer, theatre/television/film of household    Occupational performance deficits (Please refer to evaluation for details):  ADL's;IADL's;Rest and Sleep;Work;Leisure    Rehab Potential  Excellent    OT Frequency  3x / week    OT Duration  8 weeks    OT Treatment/Interventions  Self-care/ADL training;Electrical Stimulation;Therapeutic exercise;Visual/perceptual remediation/compensation;Coping strategies training;Aquatic Therapy;Neuromuscular education;Splinting;Patient/family education;Balance training;Therapeutic activities;Functional Mobility Training;Energy conservation;DME and/or AE instruction;Manual Therapy;Cognitive remediation/compensation    Plan  NMR LUE - STRENGTHENING - plank to tall plank, push up if able, coordiantion gross/fine motor, functional use of LUE    Clinical Decision Making  Several treatment options, min-mod task modification necessary    Consulted and Agree with Plan of Care  Patient       Patient will benefit from skilled therapeutic intervention in order to improve the following deficits and impairments:  Decreased cognition, Decreased knowledge of use of DME, Impaired flexibility, Impaired vision/preception, Improper body mechanics, Impaired sensation, Decreased mobility, Decreased coordination, Cardiopulmonary status limiting activity, Decreased activity tolerance, Decreased endurance, Decreased range of motion, Decreased strength, Impaired tone, Decreased coping skills, Decreased balance, Difficulty walking, Impaired perceived functional ability, Impaired UE functional use  Visit Diagnosis: Hemiplegia and hemiparesis following cerebral infarction affecting left non-dominant side (HCC)  Muscle weakness (generalized)  Unsteadiness on  feet  Other disturbances of skin sensation  Other lack of coordination  Ataxia    Problem List Patient Active Problem List   Diagnosis Date Noted  . Cerebral embolism with cerebral infarction 02/01/2018  . 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 Stroke-like symptoms   . Ataxia 01/31/2018  . Elevated blood pressure reading 01/31/2018  . Asthma     Collier Salina, OTR/L 02/24/2018, 12:20 PM  [facility] [address] [place], [place], [address] Phone: [phone]   Fax:  [phone]  Name: Banyan Goodchild MRN: [id number] Date of Birth: October 22, 1955

## 2018-02-28 ENCOUNTER — Encounter: Payer: Self-pay | Admitting: Occupational Therapy

## 2018-02-28 ENCOUNTER — Encounter: Payer: Self-pay | Admitting: Physical Therapy

## 2018-02-28 ENCOUNTER — Ambulatory Visit: Payer: Managed Care, Other (non HMO) | Admitting: Physical Therapy

## 2018-02-28 ENCOUNTER — Ambulatory Visit: Payer: Managed Care, Other (non HMO) | Admitting: Occupational Therapy

## 2018-02-28 DIAGNOSIS — R2681 Unsteadiness on feet: Secondary | ICD-10-CM

## 2018-02-28 DIAGNOSIS — R208 Other disturbances of skin sensation: Secondary | ICD-10-CM

## 2018-02-28 DIAGNOSIS — I69354 Hemiplegia and hemiparesis following cerebral infarction affecting left non-dominant side: Secondary | ICD-10-CM

## 2018-02-28 DIAGNOSIS — R270 Ataxia, unspecified: Secondary | ICD-10-CM

## 2018-02-28 DIAGNOSIS — R278 Other lack of coordination: Secondary | ICD-10-CM

## 2018-02-28 DIAGNOSIS — M6281 Muscle weakness (generalized): Secondary | ICD-10-CM

## 2018-02-28 NOTE — Patient Instructions (Signed)
Add these to your home program:  1. Sit with your elbow bent at 90* and elbow tucked into your body.  Use blue band and bring hand out to the side, keeping elbow in. Do 10, rest then do 10 more.  Do 3 sets if possible  2.  Lay on your belly with left arm hanging over the edge of your bed.  Keep elbow straight and palm turned toward your body. Hold sand bottle (we used a 1 pound weigh today).  Keeping elbow straight, raise your am up past your hip and then lower. Do 10, rest then do 10 more.  Do not do more than I tell you.

## 2018-02-28 NOTE — Therapy (Signed)
[facility] [address] [place], [place], [address] Phone: [phone]   Fax:  [phone]  Occupational Therapy Treatment  Patient Details  Name: Wayne Richards MRN: [id number] Date of Birth: [birth year] Referring Provider: Dr. Lupe Carneyean Mitchell   Encounter Date: 02/28/2018  OT End of Session - 02/28/18 1309    Visit Number  8    Number of Visits  25    Date for OT Re-Evaluation  04/09/18    Authorization Type  Aetna- 90 VL for all tree disciplines combined    OT Start Time  0932    OT Stop Time  1017    OT Time Calculation (min)  45 min    Activity Tolerance  Patient tolerated treatment well       Past Medical History:  Diagnosis Date  . Asthma   . Stroke [facility](HCC)     Past Surgical History:  Procedure Laterality Date  . ACHILLES TENDON SURGERY      There were no vitals filed for this visit.  Subjective Assessment - 02/28/18 0939    Subjective   This is frustrating to have this tendonitis flare up - its a set back.    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  Yes    Pain Score  7     Pain Location  Knee    Pain Orientation  Right    Pain Descriptors / Indicators  Burning;Sharp    Pain Type  Chronic pain    Pain Onset  In the past 7 days    Pain Frequency  Constant    Aggravating Factors   walking, any time I use the quad muscle    Pain Relieving Factors  ice, elevation helped with swelling but not with pain. This is chronic but now I can't take the medication I used to take.                    OT Treatments/Exercises (OP) - 02/28/18 0001      Neurological Re-education Exercises   Other Exercises 1  Addressed and reviewed HEP in light of recent flare up of chronic R knee tendonitis - pt today able to do 8 push ups then 5 push ups as well as hold a plank for 32 seconds.  Pt stated he  did not work on any exercises this weekend due to flare up and "feeling like I am not getting better.  But I am in a better place mentally this week and ready to work again."  Pt also states that sustained muscle activity in LUE is difficulty (i.e pouring something out of a pot with LUE or holding something heavy in L hand for more than a few moments).  Addressed sustained muscle activity with alternating and recipricol movement of UE's against wall with weighted balls. Pt with difficutly maintaining and using ER with overhead reach leading to abnormal movement pattern and decreased control. Added 2 exercises to HEP to address ER and more proximal dorsal control of L shoulder girdle - see pt instruction for details. Pt able to return demonstrate .          Balance Exercises - 02/28/18 0810      Balance Exercises: Standing   Standing Eyes Opened  Narrow base of support (BOS);Head turns;Foam/compliant surface    Standing Eyes Closed  Narrow base of support (BOS);Head turns;Foam/compliant surface  Rockerboard  Anterior/posterior;EO;EC step off/on with RLE; fwd/backward; "wall bumps"    Step Ups  Forward;6 inch;Intermittent UE support backward; using LLE x 10 each    Tandem Gait  Forward;Retro    Other Standing Exercises  corner. left foot on green oval foam, rt toes on vertical yoga block; head turns, nods, reaching across to touch opposite wall        OT Education - 02/28/18 1308    Education Details  upgraded HEP for L shoulder    Person(s) Educated  Patient    Methods  Explanation;Demonstration;Tactile cues;Verbal cues;Handout    Comprehension  Verbalized understanding;Returned demonstration       OT Short Term Goals - 02/28/18 1308      OT SHORT TERM GOAL #1   Title  Patient will complete a HEP designed to improve coord in left hand independently due 03/10/18    Status  Achieved      OT SHORT TERM GOAL #2   Title  Patient will complete a HEP designed to improve strength in left  hand independently     Status  Achieved      OT SHORT TERM GOAL #3   Title  Patient will dress lower body with no more than intermittent min assist    Status  Achieved      OT SHORT TERM GOAL #4   Title  Patient will bathe himself in the shower with no more than intermittent min assist    Status  Achieved      OT SHORT TERM GOAL #5   Title  Patient will prepare simple hot meal (using stove top or microwave) for he and his wife with modified independence    Status  Achieved      OT SHORT TERM GOAL #6   Title  Patient will demonstrate improved coordiantion as evidenced by reduced time on 9 hole peg test by 30 seconds - to aide with buttoning, picking up and manipulating small objects, eg, medications    Status  Achieved        OT Long Term Goals - 02/28/18 1308      OT LONG TERM GOAL #1   Title  Patient will complete updated HEP designed to improve range of motion and strength in left shoulder due 9/14.      Status  On-going      OT LONG TERM GOAL #2   Title  Patient will demonstrate sufficient stand balance and functional use of left arm to reach into high / low cabinets to obtain pots, pans, dishes as needed for cooking tasks    Status  On-going      OT LONG TERM GOAL #3   Title  Patient will be demonstrate sufficient left shoulder range of motion and sustained strength to don deodarant under each arm.      Status  Achieved      OT LONG TERM GOAL #4   Title  Patient will demonstrate sufficient left hand strength and coordination for aide with simple tool use (patient is right hand dominant)     Status  On-going      OT LONG TERM GOAL #5   Title  Patient will demonstrate 15 lb increase in grip strength in left hand to aide with opening jars, bottles, etc.      Status  Achieved 70 lbs!            Plan - 02/28/18 1308    Clinical Impression Statement  Pt making good  progress despite recent flare up of R knee tendonitis.    Occupational Profile and client history  currently impacting functional performance  Husband, father of 47- youngest is 74, retired Emergency planning/management officer, Teacher, English as a foreign language, Production designer, theatre/television/film of household    Occupational performance deficits (Please refer to evaluation for details):  ADL's;IADL's;Rest and Sleep;Work;Leisure    Rehab Potential  Excellent    Current Impairments/barriers affecting progress:  patient still under MD care to determine if stroke is stable - F/U CT scheduled this week.  F/U CT completed 7/18     OT Frequency  3x / week    OT Duration  8 weeks    OT Treatment/Interventions  Self-care/ADL training;Electrical Stimulation;Therapeutic exercise;Visual/perceptual remediation/compensation;Coping strategies training;Aquatic Therapy;Neuromuscular education;Splinting;Patient/family education;Balance training;Therapeutic activities;Functional Mobility Training;Energy conservation;DME and/or AE instruction;Manual Therapy;Cognitive remediation/compensation    Plan  NMR LUE - STRENGTHENING - plank to tall plank, push up if able, coordiantion gross/fine motor, functional use of LUE, scapular/trunk control for overhead reach, sustained activity with LUE    Consulted and Agree with Plan of Care  Patient       Patient will benefit from skilled therapeutic intervention in order to improve the following deficits and impairments:  Decreased cognition, Decreased knowledge of use of DME, Impaired flexibility, Impaired vision/preception, Improper body mechanics, Impaired sensation, Decreased mobility, Decreased coordination, Cardiopulmonary status limiting activity, Decreased activity tolerance, Decreased endurance, Decreased range of motion, Decreased strength, Impaired tone, Decreased coping skills, Decreased balance, Difficulty walking, Impaired perceived functional ability, Impaired UE functional use  Visit Diagnosis: Hemiplegia and hemiparesis following cerebral infarction affecting left non-dominant side (HCC)  Muscle weakness (generalized)  Unsteadiness on  feet  Other disturbances of skin sensation  Other lack of coordination  Ataxia    Problem List Patient Active Problem List   Diagnosis Date Noted  . Cerebral embolism with cerebral infarction 02/01/2018  . 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 Stroke-like symptoms   . Ataxia 01/31/2018  . Elevated blood pressure reading 01/31/2018  . Asthma     Norton Pastel, OTR/L 02/28/2018, 1:11 PM  [facility] [address] [place], [place], [address] Phone: [phone]   Fax:  [phone]  Name: Wayne Richards MRN: [id number] Date of Birth: 03-06-1956

## 2018-02-28 NOTE — Therapy (Signed)
[facility] [address] [place], [place], [address] Phone: [phone]   Fax:  [phone]  Physical Therapy Treatment  Patient Details  Name: Wayne Richards MRN: [id number] Date of Birth: [birth year] Referring Provider: Dr. Donnie Coffin   Encounter Date: 02/28/2018  PT End of Session - 02/28/18 1952    Visit Number  5    Number of Visits  17    Date for PT Re-Evaluation  04/11/18    Authorization Type  Aetna -  VL for PT/OT/ST combined: 90  If seen for all three visits on the same day it constitutes 3 visits    Authorization - Visit Number  14 as of 8/5    Authorization - Number of Visits  90    PT Start Time  0801    PT Stop Time  0844    PT Time Calculation (min)  43 min    Activity Tolerance  No increased pain    Behavior During Therapy  [facility] for tasks assessed/performed;Flat affect       Past Medical History:  Diagnosis Date  . Asthma   . Stroke [facility])     Past Surgical History:  Procedure Laterality Date  . ACHILLES TENDON SURGERY      There were no vitals filed for this visit.  Subjective Assessment - 02/28/18 0800    Subjective  Having bad tendinitis in rt knee over the weekend. Having some hard psychololgical and emotional problems with it. (Denied offer of contact information for neuropsychologist)    Patient is accompained by:  Family member spouse in lobby    Pertinent History  ataxia; asthma: HTN: acute Rt pontine infarct:  subacute Lt pontine infarct     Diagnostic tests  --    Patient Stated Goals  "Regain my normal function"    Currently in Pain?  Yes    Pain Score  7     Pain Location  Knee    Pain Orientation  Right    Pain Descriptors / Indicators  Burning;Sharp    Pain Type  Chronic pain    Pain Onset  1 to 4 weeks ago    Pain Frequency  Constant    Aggravating Factors   walking, exercises    Pain Relieving Factors  ice, elevation, rest    Effect of Pain on Daily Activities  unable  to tolerate doing all his exercises for his HEP                            Balance Exercises - 02/28/18 0810      Balance Exercises: Standing   Standing Eyes Opened  Narrow base of support (BOS);Head turns;Foam/compliant surface    Standing Eyes Closed  Narrow base of support (BOS);Head turns;Foam/compliant surface    Rockerboard  Anterior/posterior;EO;EC step off/on with RLE; fwd/backward; "wall bumps"    Step Ups  Forward;6 inch;Intermittent UE support backward; using LLE x 10 each    Tandem Gait  Forward;Retro    Partial Tandem Stance  Eyes open;Eyes closed;Foam/compliant surface    Marching Limitations  slow forward and backward interniitent UE support    Other Standing Exercises  corner. left foot on green oval foam, rt toes on vertical yoga block; head turns, nods, reaching across to touch opposite wall       Balance beam (black) using hip strategy to recover balance (and when unable via hip strategy using step strategy). Step off/on  anteriorly and posteriorly alternating LEs    PT Short Term Goals - 02/15/18 1225      PT SHORT TERM GOAL #1   Title  Improve Berg balance test score by >/= 5 points to reduce fall risk.  Will assess DGI to determine falls risk during gait    Baseline  49/56 - assess DGI at 4 weeks for long term goals    Time  4    Period  Weeks    Status  Revised    Target Date  03/11/18      PT SHORT TERM GOAL #2   Title  Improve TUG score from 28.44 secs to </= 23 secs with RW to demo improved mobility.    Baseline  28.44 secs wtih RW    Time  4    Period  Weeks    Status  New    Target Date  03/11/18      PT SHORT TERM GOAL #3   Title  Amb. 100' with SPC with CGA on flat, even surface.    Time  4    Period  Weeks    Status  New    Target Date  03/11/18      PT SHORT TERM GOAL #4   Title  Increase gait velocity from 1.11 ft/sec to >/= 1.5 ft/sec with RW for incr. gait efficiency.    Baseline  29.43 secs = 1.11 ft/sec    Time   4    Period  Weeks    Status  New    Target Date  03/11/18      PT SHORT TERM GOAL #5   Title  Independent in HEP for LLE strengthening.    Time  4    Period  Weeks    Status  New    Target Date  03/11/18        PT Long Term Goals - 02/15/18 1227      PT LONG TERM GOAL #1   Title  Pt will improve DGI by 4 points to decrease falls risk during gait    Baseline  (to be assessed at 4 weeks if BERG goal is met)    Time  8    Period  Weeks    Status  Revised    Target Date  04/11/18      PT LONG TERM GOAL #2   Title  Improve TUG score from 28.44 secs to </= 18 secs with RW for incr. functional mobility.    Baseline  28.44 secs with RW    Time  8    Period  Weeks    Status  New    Target Date  04/11/18      PT LONG TERM GOAL #3   Title  Pt will be modified independent without device for household ambulation.    Time  8    Period  Weeks    Status  New    Target Date  04/11/18      PT LONG TERM GOAL #4   Title  Amb. 500' on even/uneven surfaces with SPC for increased community accessibility.      Time  8    Period  Weeks    Status  New    Target Date  04/11/18      PT LONG TERM GOAL #5   Title  Negotiate 4 steps with 1 rail using a step over step sequence.    Time  8    Period  Weeks    Status  New    Target Date  04/11/18            Plan - 02/28/18 1953    Clinical Impression Statement  Session focused on balance training with LLE strengthening while avoiding causing increased rt knee pain. Patient's legs visibly fatigued by end of session and pt reported his rt knee pain was no worse (although remained a 7/10 as at start of session). Patient with flat affect and admitted to difficult time psychologically dealing with effects of his CVA. However he refused offer to provide contact info for neuropsychologist.     PT Frequency  2x / week    PT Duration  8 weeks    PT Treatment/Interventions  ADLs/Self Care Home Management;Aquatic Therapy;Functional mobility  training;Stair training;Gait training;DME Instruction;Therapeutic activities;Therapeutic exercise;Balance training;Neuromuscular re-education;Patient/family education;Passive range of motion;Electrical Stimulation    PT Next Visit Plan  If rt knee less painful: Gait without cane indoor and outdoor; incorporate golf into treatment, Jeani Hawking forgot to add standing balance to HEP based on BERG results.  NMR LLE. Balance on compliant surfaces.     Consulted and Agree with Plan of Care  Patient       Patient will benefit from skilled therapeutic intervention in order to improve the following deficits and impairments:  Abnormal gait, Decreased activity tolerance, Decreased balance, Decreased coordination, Decreased range of motion, Decreased knowledge of use of DME, Decreased endurance, Decreased strength, Impaired vision/preception, Impaired tone, Impaired sensation, Impaired UE functional use  Visit Diagnosis: Hemiplegia and hemiparesis following cerebral infarction affecting left non-dominant side (HCC)  Muscle weakness (generalized)  Unsteadiness on feet     Problem List Patient Active Problem List   Diagnosis Date Noted  . Cerebral embolism with cerebral infarction 02/01/2018  . Stroke (cerebrum) ([place]) 02/01/2018  . Stroke-like symptoms   . Ataxia 01/31/2018  . Elevated blood pressure reading 01/31/2018  . Asthma     Rexanne Mano, PT 02/28/2018, 8:03 PM  [facility] [address] [place], [place], [address] Phone: [phone]   Fax:  [phone]  Name: Novak Stgermaine MRN: [id number] Date of Birth: [birth year]

## 2018-03-01 ENCOUNTER — Ambulatory Visit: Payer: Managed Care, Other (non HMO) | Admitting: Physical Therapy

## 2018-03-01 ENCOUNTER — Ambulatory Visit: Payer: Managed Care, Other (non HMO) | Admitting: Occupational Therapy

## 2018-03-01 ENCOUNTER — Encounter: Payer: Self-pay | Admitting: Occupational Therapy

## 2018-03-01 ENCOUNTER — Encounter: Payer: Self-pay | Admitting: Physical Therapy

## 2018-03-01 DIAGNOSIS — R270 Ataxia, unspecified: Secondary | ICD-10-CM

## 2018-03-01 DIAGNOSIS — R278 Other lack of coordination: Secondary | ICD-10-CM

## 2018-03-01 DIAGNOSIS — I69354 Hemiplegia and hemiparesis following cerebral infarction affecting left non-dominant side: Secondary | ICD-10-CM

## 2018-03-01 DIAGNOSIS — I69315 Cognitive social or emotional deficit following cerebral infarction: Secondary | ICD-10-CM

## 2018-03-01 DIAGNOSIS — M6281 Muscle weakness (generalized): Secondary | ICD-10-CM

## 2018-03-01 DIAGNOSIS — R208 Other disturbances of skin sensation: Secondary | ICD-10-CM

## 2018-03-01 DIAGNOSIS — R2681 Unsteadiness on feet: Secondary | ICD-10-CM

## 2018-03-01 NOTE — Patient Instructions (Addendum)
Abduction: Clam (Eccentric) - Side-Lying    Lie on right side with knees bent and green band around your thighs above your knees. Lift top knee, keeping feet together. Do not let hips roll backwards (maybe lie on your couch with your back to the back of the couch). Hold for 3-5 seconds. _10__ reps per set, _2__ sets, 2 times per day,  _5__ days per week.   For this balance exercise, stand with your back to the corner. Place a chair turned backwards in front of you for safety.    Feet Partial Heel-Toe (Compliant Surface) Head Motion - Eyes Closed    Stand on compliant surface: pillow (outdoor chair pillows work well) with right foot partially in front of the other. Close eyes and hold for 30 seconds. Repeat x 3. Once you can do 3 reps without touching the walls or chair, then add head turns left and right x 10 and up and down x 10.   Repeat __1__ times per session. Do __2__ sessions per day.  Copyright  VHI. All rights reserved.   KNEE: Flexion - Prone    Bend knee. Raise heel toward buttocks. Do not raise hips. __10_ reps per set, _2__ sets per day, __5_ days per week   Copyright  VHI. All rights reserved.

## 2018-03-01 NOTE — Therapy (Signed)
[facility] [address] [place], [place], [address] Phone: [phone]   Fax:  [phone]  Physical Therapy Treatment  Patient Details  Name: Wayne Richards MRN: [id number] Date of Birth: 02/22/1956 Referring Provider: Dr. Dean Mitchell   Encounter Date: 03/01/2018  PT End of Session - 03/01/18 0852    Visit Number  6    Number of Visits  17    Date for PT Re-Evaluation  04/11/18    Authorization Type  Aetna -  VL for PT/OT/ST combined: 90  If seen for all three visits on the same day it constitutes 3 visits    Authorization - Visit Number  16 as of 8/6  (includes today's PT/OT)   Authorization - Number of Visits  90    PT Start Time  0801    PT Stop Time  0845    PT Time Calculation (min)  44 min    Activity Tolerance  No increased pain;Patient tolerated treatment well    Behavior During Therapy  WFL for tasks assessed/performed;Flat affect       Past Medical History:  Diagnosis Date  . Asthma   . Stroke (HCC)     Past Surgical History:  Procedure Laterality Date  . ACHILLES TENDON SURGERY      There were no vitals filed for this visit.  Subjective Assessment - 03/01/18 0803    Subjective  I iced my leg 3 times yesterday.  What muscle do I need to work to be able to (demonstrates raising Lt foot to cross rt knee) to help with getting dressed?   Patient is accompained by:     Pertinent History  ataxia; asthma: HTN: acute Rt pontine infarct:  subacute Lt pontine infarct     Patient Stated Goals  "Regain my normal function"    Currently in Pain?  Yes    Pain Score  3     Pain Location  Knee    Pain Orientation  Right    Pain Descriptors / Indicators  Burning;Sharp    Pain Onset  1 to 4 weeks ago    Pain Frequency  Constant    Aggravating Factors   any time I use the quad muscle    Pain Relieving Factors  ice elevation tylenol                       [facility] Adult PT Treatment/Exercise -  03/01/18 0001      Knee/Hip Exercises: Machines for Strengthening   Total Gym Leg Press  100# LLE only 5 second hold at near full extension x 10; 2 sets       Knee/Hip Exercises: Sidelying   Clams  left with green band; 5sec hold x 10; second set 3 sec hold x 7      Knee/Hip Exercises: Prone   Hamstring Curl  1 set;10 reps PROM x1; AAROM x 7; AROM x 3      Reviewed corner balance exercise from 02/28/18 that PT forgot to give him a handout for. Given today       PT Education - 03/01/18 0851    Education Details  continue to avoid steps leading with RLE due to tendinitis; continue ice and rest as much as possible; additions to HEP    Person(s) Educated  Patient    Methods  Explanation;Demonstration;Tactile cues;Verbal cues;Handout    Comprehension  Verbalized understanding;Returned demonstration;Verbal cues required;Tactile cues required;Need further instruction         PT Short Term Goals - 02/15/18 1225      PT SHORT TERM GOAL #1   Title  Improve Berg balance test score by >/= 5 points to reduce fall risk.  Will assess DGI to determine falls risk during gait    Baseline  49/56 - assess DGI at 4 weeks for long term goals    Time  4    Period  Weeks    Status  Revised    Target Date  03/11/18      PT SHORT TERM GOAL #2   Title  Improve TUG score from 28.44 secs to </= 23 secs with RW to demo improved mobility.    Baseline  28.44 secs wtih RW    Time  4    Period  Weeks    Status  New    Target Date  03/11/18      PT SHORT TERM GOAL #3   Title  Amb. 100' with SPC with CGA on flat, even surface.    Time  4    Period  Weeks    Status  New    Target Date  03/11/18      PT SHORT TERM GOAL #4   Title  Increase gait velocity from 1.11 ft/sec to >/= 1.5 ft/sec with RW for incr. gait efficiency.    Baseline  29.43 secs = 1.11 ft/sec    Time  4    Period  Weeks    Status  New    Target Date  03/11/18      PT SHORT TERM GOAL #5   Title  Independent in HEP for LLE  strengthening.    Time  4    Period  Weeks    Status  New    Target Date  03/11/18        PT Long Term Goals - 02/15/18 1227      PT LONG TERM GOAL #1   Title  Pt will improve DGI by 4 points to decrease falls risk during gait    Baseline  (to be assessed at 4 weeks if BERG goal is met)    Time  8    Period  Weeks    Status  Revised    Target Date  04/11/18      PT LONG TERM GOAL #2   Title  Improve TUG score from 28.44 secs to </= 18 secs with RW for incr. functional mobility.    Baseline  28.44 secs with RW    Time  8    Period  Weeks    Status  New    Target Date  04/11/18      PT LONG TERM GOAL #3   Title  Pt will be modified independent without device for household ambulation.    Time  8    Period  Weeks    Status  New    Target Date  04/11/18      PT LONG TERM GOAL #4   Title  Amb. 500' on even/uneven surfaces with SPC for increased community accessibility.      Time  8    Period  Weeks    Status  New    Target Date  04/11/18      PT LONG TERM GOAL #5   Title  Negotiate 4 steps with 1 rail using a step over step sequence.    Time  8    Period  Weeks    Status  New  Target Date  04/11/18            Plan - 03/01/18 0854    Clinical Impression Statement  Session focused on LLE strengthening and updating HEP. Patient reported no increased rt knee pain with today's activities. Educated on continued care of rt knee to relieve tendinitis. Patient very motivated and frustrated by "set back" due to rt knee pain.     PT Frequency  2x / week    PT Duration  8 weeks    PT Treatment/Interventions  ADLs/Self Care Home Management;Aquatic Therapy;Functional mobility training;Stair training;Gait training;DME Instruction;Therapeutic activities;Therapeutic exercise;Balance training;Neuromuscular re-education;Patient/family education;Passive range of motion;Electrical Stimulation    PT Next Visit Plan  If rt knee less painful: Gait without cane indoor and outdoor;  incorporate golf into treatment,  NMR LLE (esp hamstrings, quad control). Balance on compliant surfaces.     Consulted and Agree with Plan of Care  Patient       Patient will benefit from skilled therapeutic intervention in order to improve the following deficits and impairments:  Abnormal gait, Decreased activity tolerance, Decreased balance, Decreased coordination, Decreased range of motion, Decreased knowledge of use of DME, Decreased endurance, Decreased strength, Impaired vision/preception, Impaired tone, Impaired sensation, Impaired UE functional use  Visit Diagnosis: Hemiplegia and hemiparesis following cerebral infarction affecting left non-dominant side (HCC)  Muscle weakness (generalized)     Problem List Patient Active Problem List   Diagnosis Date Noted  . Cerebral embolism with cerebral infarction 02/01/2018  . 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 Stroke-like symptoms   . Ataxia 01/31/2018  . Elevated blood pressure reading 01/31/2018  . Asthma     Lynn P Sasser, PT 03/01/2018, 8:57 AM  [facility] [address] [place], [place], [address] Phone: [phone]   Fax:  [phone]  Name: Wayne Richards MRN: [id number] Date of Birth: 07/26/1956   

## 2018-03-01 NOTE — Therapy (Signed)
[facility] [address] [place], [place], [address] Phone: [phone]   Fax:  [phone]  Occupational Therapy Treatment  Patient Details  Name: Wayne Richards MRN: [id number] Date of Birth: 03-14-1956 Referring Provider: Dr. Lupe Carneyean Mitchell   Encounter Date: 03/01/2018  OT End of Session - 03/01/18 1219    Visit Number  9    Number of Visits  25    Date for OT Re-Evaluation  04/09/18    Authorization Type  [employer]- 90 VL for all tree disciplines combined    OT Start Time  0847    OT Stop Time  0931    OT Time Calculation (min)  44 min    Activity Tolerance  Patient tolerated treatment well       Past Medical History:  Diagnosis Date  . Asthma   . Stroke [facility](HCC)     Past Surgical History:  Procedure Laterality Date  . ACHILLES TENDON SURGERY      There were no vitals filed for this visit.  Subjective Assessment - 03/01/18 0851    Subjective   My knee is better today    Pertinent History  hypertension, vertigo, hyperlipidemia- now with diagnosed subacute Left pons infarct, and acute right pons infarct    Currently in Pain?  Yes    Pain Score  5     Pain Orientation  Right    Pain Descriptors / Indicators  Burning;Sharp    Pain Type  Chronic pain    Pain Onset  1 to 4 weeks ago    Pain Frequency  Constant    Aggravating Factors   it feels better today - it was a 3 when I got here but after PT it's a 5 but it is definitely better today.     Pain Relieving Factors  ice, elevation, tylenol                   OT Treatments/Exercises (OP) - 03/01/18 0001      Neurological Re-education Exercises   Other Exercises 1  Neuro re ed in sidelying initially to address low to mid reach unilaterally in closed chain on moveable surface to address more normal reach pattern.  Also addressed ER with 2 pound weight against gravity as well as abduction and stabillzation activities for shoulder in extension patterns.   Transitioned into sitting and addresed closed chain overhead reach with min facilitation to avoid shoulder elevation and to faciltate scapular movement.  Also addresed in sitting working in ER with extension and body weight for heavy weight bearing as well as against the wall in overhead reach to stabilize as well as activate scapular musculature.  Transitioned into unilateral reach with LUE and pt able to demonstrate more normal movement patttern and greater ease with reach at approxiately 110*.       Manual Therapy   Manual therapy comments  soft tissue and scapular mob to address tightness, malalignment and poor balance of muscle activation around Lshoulder girdle.  Pt's L scapula elevated, abducted and upwardly rotated. All prior to neuro re ed.  Pt tolerated well and alignment improved          Balance Exercises - 02/28/18 0810      Balance Exercises: Standing   Standing Eyes Opened  Narrow base of support (BOS);Head turns;Foam/compliant surface    Standing Eyes Closed  Narrow base of support (BOS);Head turns;Foam/compliant surface    Rockerboard  Anterior/posterior;EO;EC step off/on with RLE; fwd/backward; "wall bumps"  Step Ups  Forward;6 inch;Intermittent UE support backward; using LLE x 10 each    Tandem Gait  Forward;Retro    Partial Tandem Stance  Eyes open;Eyes closed;Foam/compliant surface    Marching Limitations  slow forward and backward interniitent UE support    Other Standing Exercises  corner. left foot on green oval foam, rt toes on vertical yoga block; head turns, nods, reaching across to touch opposite wall        OT Education - 02/28/18 1308    Education Details  upgraded HEP for L shoulder    Person(s) Educated  Patient    Methods  Explanation;Demonstration;Tactile cues;Verbal cues;Handout    Comprehension  Verbalized understanding;Returned demonstration       OT Short Term Goals - 03/01/18 1218      OT SHORT TERM GOAL #1   Title  Patient will complete a  HEP designed to improve coord in left hand independently due 03/10/18    Status  Achieved      OT SHORT TERM GOAL #2   Title  Patient will complete a HEP designed to improve strength in left hand independently     Status  Achieved      OT SHORT TERM GOAL #3   Title  Patient will dress lower body with no more than intermittent min assist    Status  Achieved      OT SHORT TERM GOAL #4   Title  Patient will bathe himself in the shower with no more than intermittent min assist    Status  Achieved      OT SHORT TERM GOAL #5   Title  Patient will prepare simple hot meal (using stove top or microwave) for he and his wife with modified independence    Status  Achieved      OT SHORT TERM GOAL #6   Title  Patient will demonstrate improved coordiantion as evidenced by reduced time on 9 hole peg test by 30 seconds - to aide with buttoning, picking up and manipulating small objects, eg, medications    Status  Achieved        OT Long Term Goals - 03/01/18 1218      OT LONG TERM GOAL #1   Title  Patient will complete updated HEP designed to improve range of motion and strength in left shoulder due 9/14.      Status  On-going      OT LONG TERM GOAL #2   Title  Patient will demonstrate sufficient stand balance and functional use of left arm to reach into high / low cabinets to obtain pots, pans, dishes as needed for cooking tasks    Status  On-going      OT LONG TERM GOAL #3   Title  Patient will be demonstrate sufficient left shoulder range of motion and sustained strength to don deodarant under each arm.      Status  Achieved      OT LONG TERM GOAL #4   Title  Patient will demonstrate sufficient left hand strength and coordination for aide with simple tool use (patient is right hand dominant)     Status  On-going      OT LONG TERM GOAL #5   Title  Patient will demonstrate 15 lb increase in grip strength in left hand to aide with opening jars, bottles, etc.      Status  Achieved 70 lbs!             Plan -  03/01/18 1218    Clinical Impression Statement  Pt continues to progress toward goals and demonstrates improved LUE functional reach as well as improved balance.     Occupational Profile and client history currently impacting functional performance  Husband, father of 100- youngest is 67, retired Emergency planning/management officer, Teacher, English as a foreign language, Production designer, theatre/television/film of household    Occupational performance deficits (Please refer to evaluation for details):  ADL's;IADL's;Rest and Sleep;Work;Leisure    Rehab Potential  Excellent    Current Impairments/barriers affecting progress:  patient still under MD care to determine if stroke is stable - F/U CT scheduled this week.  F/U CT completed 7/18     OT Frequency  3x / week    OT Duration  8 weeks    OT Treatment/Interventions  Self-care/ADL training;Electrical Stimulation;Therapeutic exercise;Visual/perceptual remediation/compensation;Coping strategies training;Aquatic Therapy;Neuromuscular education;Splinting;Patient/family education;Balance training;Therapeutic activities;Functional Mobility Training;Energy conservation;DME and/or AE instruction;Manual Therapy;Cognitive remediation/compensation    Plan  manual to address scapular alignment. NMR LUE - STRENGTHENING - plank to tall plank, push up if able, coordiantion gross/fine motor, functional use of LUE, scapular/trunk control for overhead reach, sustained activity with LUE    Consulted and Agree with Plan of Care  Patient       Patient will benefit from skilled therapeutic intervention in order to improve the following deficits and impairments:  Decreased cognition, Decreased knowledge of use of DME, Impaired flexibility, Impaired vision/preception, Improper body mechanics, Impaired sensation, Decreased mobility, Decreased coordination, Cardiopulmonary status limiting activity, Decreased activity tolerance, Decreased endurance, Decreased range of motion, Decreased strength, Impaired tone, Decreased coping skills,  Decreased balance, Difficulty walking, Impaired perceived functional ability, Impaired UE functional use  Visit Diagnosis: Hemiplegia and hemiparesis following cerebral infarction affecting left non-dominant side (HCC)  Muscle weakness (generalized)  Unsteadiness on feet  Other disturbances of skin sensation  Other lack of coordination  Cognitive social or emotional deficit following cerebral infarction  Ataxia    Problem List Patient Active Problem List   Diagnosis Date Noted  . Cerebral embolism with cerebral infarction 02/01/2018  . 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 Stroke-like symptoms   . Ataxia 01/31/2018  . Elevated blood pressure reading 01/31/2018  . Asthma     Norton Pastel, OTR/L 03/01/2018, 12:21 PM  [facility] [address] [place], [place], [address] Phone: [phone]   Fax:  [phone]  Name: Wayne Richards MRN: [id number] Date of Birth: November 24, 1955

## 2018-03-03 ENCOUNTER — Ambulatory Visit: Payer: Managed Care, Other (non HMO) | Admitting: Occupational Therapy

## 2018-03-03 ENCOUNTER — Encounter: Payer: Self-pay | Admitting: Occupational Therapy

## 2018-03-03 DIAGNOSIS — M6281 Muscle weakness (generalized): Secondary | ICD-10-CM

## 2018-03-03 DIAGNOSIS — I69315 Cognitive social or emotional deficit following cerebral infarction: Secondary | ICD-10-CM

## 2018-03-03 DIAGNOSIS — R278 Other lack of coordination: Secondary | ICD-10-CM

## 2018-03-03 DIAGNOSIS — R270 Ataxia, unspecified: Secondary | ICD-10-CM

## 2018-03-03 DIAGNOSIS — R208 Other disturbances of skin sensation: Secondary | ICD-10-CM

## 2018-03-03 DIAGNOSIS — I69354 Hemiplegia and hemiparesis following cerebral infarction affecting left non-dominant side: Secondary | ICD-10-CM | POA: Diagnosis not present

## 2018-03-03 DIAGNOSIS — R2681 Unsteadiness on feet: Secondary | ICD-10-CM

## 2018-03-03 NOTE — Therapy (Signed)
[facility] [address] [place], [place], [address] Phone: [phone]   Fax:  [phone]  Occupational Therapy Treatment  Patient Details  Name: Wayne Richards MRN: [id number] Date of Birth: [birth year] Referring Provider: Dr. Lupe Carney   Encounter Date: 03/03/2018  OT End of Session - 03/03/18 1138    Visit Number  10    Number of Visits  25    Date for OT Re-Evaluation  04/09/18    Authorization Type  [employer]- 90 VL for all three disciplines combined    OT Start Time  0932    OT Stop Time  1016    OT Time Calculation (min)  44 min    Activity Tolerance  Patient tolerated treatment well       Past Medical History:  Diagnosis Date  . Asthma   . Stroke [facility])     Past Surgical History:  Procedure Laterality Date  . ACHILLES TENDON SURGERY      There were no vitals filed for this visit.  Subjective Assessment - 03/03/18 0936    Subjective   I feel great today    Pertinent History  hypertension, vertigo, hyperlipidemia- now with diagnosed subacute Left pons infarct, and acute right pons infarct    Currently in Pain?  Yes    Pain Score  1     Pain Location  Knee    Pain Orientation  Right    Pain Descriptors / Indicators  Tender    Pain Onset  1 to 4 weeks ago    Pain Frequency  Intermittent    Aggravating Factors   touching over knee cap where I think it is bruised    Pain Relieving Factors  I stumbled and stepped hard on my leg and for some reason the pain went away                   OT Treatments/Exercises (OP) - 03/03/18 0001      Neurological Re-education Exercises   Other Exercises 1  Neuro re ed in sitting uisng physioball to address trunk control (pt with increase weight on L side, passive elongation and pelvic obliquity, decreased activity for lateral weight shfits.  Also addressed on mat with feet off ground including resistance at end range through UE's for lateral weight shifts  with holding, also using active rotation to each side with A/P tilits in rotation with resistance.  Incorporated UE into core stability in sitting using forced paradaigm to address shoulder depression, adduciton and ER of LUE with more active trunk. Progressed to bilateral overhead reach in closed chain with facilitation of full elbow extension and discouraging "holding" through head and neck.                 OT Short Term Goals - 03/03/18 1136      OT SHORT TERM GOAL #1   Title  Patient will complete a HEP designed to improve coord in left hand independently due 03/10/18    Status  Achieved      OT SHORT TERM GOAL #2   Title  Patient will complete a HEP designed to improve strength in left hand independently     Status  Achieved      OT SHORT TERM GOAL #3   Title  Patient will dress lower body with no more than intermittent min assist    Status  Achieved      OT SHORT TERM GOAL #4   Title  Patient will bathe himself in the shower with no more than intermittent min assist    Status  Achieved      OT SHORT TERM GOAL #5   Title  Patient will prepare simple hot meal (using stove top or microwave) for he and his wife with modified independence    Status  Achieved      OT SHORT TERM GOAL #6   Title  Patient will demonstrate improved coordiantion as evidenced by reduced time on 9 hole peg test by 30 seconds - to aide with buttoning, picking up and manipulating small objects, eg, medications    Status  Achieved        OT Long Term Goals - 03/03/18 1136      OT LONG TERM GOAL #1   Title  Patient will complete updated HEP designed to improve range of motion and strength in left shoulder due 9/14.      Status  On-going      OT LONG TERM GOAL #2   Title  Patient will demonstrate sufficient stand balance and functional use of left arm to reach into high / low cabinets to obtain pots, pans, dishes as needed for cooking tasks    Status  On-going      OT LONG TERM GOAL #3   Title   Patient will be demonstrate sufficient left shoulder range of motion and sustained strength to don deodarant under each arm.      Status  Achieved      OT LONG TERM GOAL #4   Title  Patient will demonstrate sufficient left hand strength and coordination for aide with simple tool use (patient is right hand dominant)     Status  On-going      OT LONG TERM GOAL #5   Title  Patient will demonstrate 15 lb increase in grip strength in left hand to aide with opening jars, bottles, etc.      Status  Achieved            Plan - 03/03/18 1137    Clinical Impression Statement  Pt with improved scapular alignment and overhead reach patterns today - will required focus on more active trunk and fully isolated control for reach with LUE    Occupational Profile and client history currently impacting functional performance  Husband, father of 4- youngest is 66, retired Emergency planning/management officer, Teacher, English as a foreign language, Production designer, theatre/television/film of household    Occupational performance deficits (Please refer to evaluation for details):  ADL's;IADL's;Rest and Sleep;Work;Leisure    Rehab Potential  Excellent    Current Impairments/barriers affecting progress:  patient still under MD care to determine if stroke is stable - F/U CT scheduled this week.  F/U CT completed 7/18     OT Frequency  3x / week    OT Duration  8 weeks    OT Treatment/Interventions  Self-care/ADL training;Electrical Stimulation;Therapeutic exercise;Visual/perceptual remediation/compensation;Coping strategies training;Aquatic Therapy;Neuromuscular education;Splinting;Patient/family education;Balance training;Therapeutic activities;Functional Mobility Training;Energy conservation;DME and/or AE instruction;Manual Therapy;Cognitive remediation/compensation    Plan  manual to address scapular alignment. NMR LUE - STRENGTHENING - plank to tall plank, push up if able, coordiantion gross/fine motor, functional use of LUE, scapular/trunk control for overhead reach, sustained activity with LUE     Consulted and Agree with Plan of Care  Patient       Patient will benefit from skilled therapeutic intervention in order to improve the following deficits and impairments:  Decreased cognition, Decreased knowledge of use of DME, Impaired flexibility, Impaired vision/preception, Improper body mechanics, Impaired  sensation, Decreased mobility, Decreased coordination, Cardiopulmonary status limiting activity, Decreased activity tolerance, Decreased endurance, Decreased range of motion, Decreased strength, Impaired tone, Decreased coping skills, Decreased balance, Difficulty walking, Impaired perceived functional ability, Impaired UE functional use  Visit Diagnosis: Hemiplegia and hemiparesis following cerebral infarction affecting left non-dominant side (HCC)  Muscle weakness (generalized)  Unsteadiness on feet  Other disturbances of skin sensation  Other lack of coordination  Cognitive social or emotional deficit following cerebral infarction  Ataxia    Problem List Patient Active Problem List   Diagnosis Date Noted  . Cerebral embolism with cerebral infarction 02/01/2018  . 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 Stroke-like symptoms   . Ataxia 01/31/2018  . Elevated blood pressure reading 01/31/2018  . Asthma     Norton Pastelulaski, Cartrell Bentsen Halliday, OTR/L 03/03/2018, 11:39 AM  [facility] [address] [place], [place], [address] Phone: [phone]   Fax:  [phone]  Name: Wayne Richards MRN: [id number] Date of Birth: August 16, 1955

## 2018-03-07 ENCOUNTER — Ambulatory Visit: Payer: Managed Care, Other (non HMO) | Admitting: Occupational Therapy

## 2018-03-07 ENCOUNTER — Encounter: Payer: Self-pay | Admitting: Occupational Therapy

## 2018-03-07 DIAGNOSIS — R270 Ataxia, unspecified: Secondary | ICD-10-CM

## 2018-03-07 DIAGNOSIS — R2681 Unsteadiness on feet: Secondary | ICD-10-CM

## 2018-03-07 DIAGNOSIS — R278 Other lack of coordination: Secondary | ICD-10-CM

## 2018-03-07 DIAGNOSIS — R208 Other disturbances of skin sensation: Secondary | ICD-10-CM

## 2018-03-07 DIAGNOSIS — M6281 Muscle weakness (generalized): Secondary | ICD-10-CM

## 2018-03-07 DIAGNOSIS — I69354 Hemiplegia and hemiparesis following cerebral infarction affecting left non-dominant side: Secondary | ICD-10-CM | POA: Diagnosis not present

## 2018-03-07 DIAGNOSIS — I69315 Cognitive social or emotional deficit following cerebral infarction: Secondary | ICD-10-CM

## 2018-03-07 NOTE — Therapy (Signed)
[facility] [address] [place], [place], [address] Phone: [phone]   Fax:  [phone]  Occupational Therapy Treatment  Patient Details  Name: Wayne Richards MRN: [id number] Date of Birth: 02-11-1956 Referring Provider: Dr. Lupe Carney   Encounter Date: 03/07/2018  OT End of Session - 03/07/18 1617    Visit Number  11    Number of Visits  25    Date for OT Re-Evaluation  04/09/18    Authorization Type  [employer]- 90 VL for all three disciplines combined    OT Start Time  1447    OT Stop Time  1532    OT Time Calculation (min)  45 min    Activity Tolerance  Patient tolerated treatment well       Past Medical History:  Diagnosis Date  . Asthma   . Stroke [facility])     Past Surgical History:  Procedure Laterality Date  . ACHILLES TENDON SURGERY      There were no vitals filed for this visit.  Subjective Assessment - 03/07/18 1451    Subjective   I am so glad my sister got to watch therapy today    Pertinent History  hypertension, vertigo, hyperlipidemia- now with diagnosed subacute Left pons infarct, and acute right pons infarct    Currently in Pain?  No/denies                   OT Treatments/Exercises (OP) - 03/07/18 0001      Neurological Re-education Exercises   Other Exercises 1  Neuro re ed in sitting and standing as well as working on high stool to address trunk control and alignment, shoulder girdle alignment.  Also addressed scapular stability, stable moblity and mobility with overhead reach in closed chain against wall.  Addressed incorporating neutral to ER with overhead reach carrying items as well as throwing and catching weighted ball with L hand - pt cued to throw overhead with targets requiring ER .  Pt today also able to complete 12 pushups and hold plank for 1 minute.  Addressed active shortening of L side of trunk with increased activity in all activities.  Pt improving with isolated  control of LUE as well as increased ability to self monitor for shoulder hiking, holding through head and neck, and force of use of LUE relative to activity.                OT Short Term Goals - 03/07/18 1615      OT SHORT TERM GOAL #1   Title  Patient will complete a HEP designed to improve coord in left hand independently due 03/10/18    Status  Achieved      OT SHORT TERM GOAL #2   Title  Patient will complete a HEP designed to improve strength in left hand independently     Status  Achieved      OT SHORT TERM GOAL #3   Title  Patient will dress lower body with no more than intermittent min assist    Status  Achieved      OT SHORT TERM GOAL #4   Title  Patient will bathe himself in the shower with no more than intermittent min assist    Status  Achieved      OT SHORT TERM GOAL #5   Title  Patient will prepare simple hot meal (using stove top or microwave) for he and his wife with modified independence    Status  Achieved      OT SHORT TERM GOAL #6   Title  Patient will demonstrate improved coordiantion as evidenced by reduced time on 9 hole peg test by 30 seconds - to aide with buttoning, picking up and manipulating small objects, eg, medications    Status  Achieved        OT Long Term Goals - 03/07/18 1616      OT LONG TERM GOAL #1   Title  Patient will complete updated HEP designed to improve range of motion and strength in left shoulder due 9/14.      Status  On-going      OT LONG TERM GOAL #2   Title  Patient will demonstrate sufficient stand balance and functional use of left arm to reach into high / low cabinets to obtain pots, pans, dishes as needed for cooking tasks    Status  On-going      OT LONG TERM GOAL #3   Title  Patient will be demonstrate sufficient left shoulder range of motion and sustained strength to don deodarant under each arm.      Status  Achieved      OT LONG TERM GOAL #4   Title  Patient will demonstrate sufficient left hand  strength and coordination for aide with simple tool use (patient is right hand dominant)     Status  On-going      OT LONG TERM GOAL #5   Title  Patient will demonstrate 15 lb increase in grip strength in left hand to aide with opening jars, bottles, etc.      Status  Achieved   70 lbs!           Plan - 03/07/18 1616    Clinical Impression Statement  Pt continues to make excellent progress toward goals. Pt with improving isolated control of LUE as well as improving functional mobility.     Occupational Profile and client history currently impacting functional performance  Husband, father of 504- youngest is 8119, retired Emergency planning/management officerpolice officer, Teacher, English as a foreign languagegolfer, Production designer, theatre/television/filmmanager of household    Occupational performance deficits (Please refer to evaluation for details):  ADL's;IADL's;Rest and Sleep;Work;Leisure    Rehab Potential  Excellent    Current Impairments/barriers affecting progress:  patient still under MD care to determine if stroke is stable - F/U CT scheduled this week.  F/U CT completed 7/18     OT Frequency  3x / week    OT Duration  8 weeks    Plan  manual to address scapular alignment. NMR LUE - STRENGTHENING - plank to tall plank, push up if able, coordiantion gross/fine motor, functional use of LUE, scapular/trunk control for overhead reach, sustained activity with LUE    Consulted and Agree with Plan of Care  Patient;Family member/caregiver    Family Member Consulted  sister Tommye StandardMadonna Nash       Patient will benefit from skilled therapeutic intervention in order to improve the following deficits and impairments:  Decreased cognition, Decreased knowledge of use of DME, Impaired flexibility, Impaired vision/preception, Improper body mechanics, Impaired sensation, Decreased mobility, Decreased coordination, Cardiopulmonary status limiting activity, Decreased activity tolerance, Decreased endurance, Decreased range of motion, Decreased strength, Impaired tone, Decreased coping skills, Decreased balance,  Difficulty walking, Impaired perceived functional ability, Impaired UE functional use  Visit Diagnosis: Hemiplegia and hemiparesis following cerebral infarction affecting left non-dominant side (HCC)  Muscle weakness (generalized)  Unsteadiness on feet  Other disturbances of skin sensation  Other lack of coordination  Cognitive social or  emotional deficit following cerebral infarction  Ataxia    Problem List Patient Active Problem List   Diagnosis Date Noted  . Cerebral embolism with cerebral infarction 02/01/2018  . 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 Stroke-like symptoms   . Ataxia 01/31/2018  . Elevated blood pressure reading 01/31/2018  . Asthma     Norton Pastelulaski, Kenlee Maler Halliday, OTR/L 03/07/2018, 4:18 PM  [facility] [address] [place], [place], [address] Phone: [phone]   Fax:  [phone]  Name: Wayne Richards MRN: [id number] Date of Birth: [birth year]

## 2018-03-09 ENCOUNTER — Ambulatory Visit: Payer: Self-pay | Admitting: Adult Health

## 2018-03-09 ENCOUNTER — Ambulatory Visit: Payer: Managed Care, Other (non HMO) | Admitting: Physical Therapy

## 2018-03-09 ENCOUNTER — Ambulatory Visit: Payer: Managed Care, Other (non HMO) | Admitting: Occupational Therapy

## 2018-03-09 ENCOUNTER — Encounter: Payer: Self-pay | Admitting: Physical Therapy

## 2018-03-09 DIAGNOSIS — R278 Other lack of coordination: Secondary | ICD-10-CM

## 2018-03-09 DIAGNOSIS — I69354 Hemiplegia and hemiparesis following cerebral infarction affecting left non-dominant side: Secondary | ICD-10-CM | POA: Diagnosis not present

## 2018-03-09 DIAGNOSIS — R208 Other disturbances of skin sensation: Secondary | ICD-10-CM

## 2018-03-09 DIAGNOSIS — M6281 Muscle weakness (generalized): Secondary | ICD-10-CM

## 2018-03-09 NOTE — Therapy (Signed)
[facility] [address] [place], [place], [address] Phone: [phone]   Fax:  [phone]  Occupational Therapy Treatment  Patient Details  Name: Wayne Richards MRN: [id number] Date of Birth: 06-19-1956 Referring Provider: Dr. Lupe Carneyean Mitchell   Encounter Date: 03/09/2018  OT End of Session - 03/09/18 0858    Visit Number  12    Number of Visits  25    Date for OT Re-Evaluation  04/09/18    Authorization Type  Aetna- 90 VL for all three disciplines combined    OT Start Time  0853    OT Stop Time  0931    OT Time Calculation (min)  38 min       Past Medical History:  Diagnosis Date  . Asthma   . Stroke [facility](HCC)     Past Surgical History:  Procedure Laterality Date  . ACHILLES TENDON SURGERY      There were no vitals filed for this visit.  Subjective Assessment - 03/09/18 1645    Pertinent History  hypertension, vertigo, hyperlipidemia- now with diagnosed subacute Left pons infarct, and acute right pons infarct    Currently in Pain?  No/denies              Treatment: rolling ball up vertical surface with LUE, 5 reps, min v.c for positioning, ambulating while dribbling a medium ball for timing and coordination, supervision/ min difficulty. Closed chain shoulder flexion to midrange with medium weighted ball, min v.c/ facilitation, tossing and catching small weighted ball with LUE against trampoline, mod difficulty. Plank x 5 reps holding 10 secs each, min v.c Grooved pegboard for increased fine motor coordination/ in hand manipulation, min dfifficulty Quadraped lifting alternate UE/LE's, min facilitation for core stability and strength. Pushups x 10 reps, Arm bike level 6 x 6 mins for conditioning.               OT Short Term Goals - 03/07/18 1615      OT SHORT TERM GOAL #1   Title  Patient will complete a HEP designed to improve coord in left hand independently due 03/10/18    Status   Achieved      OT SHORT TERM GOAL #2   Title  Patient will complete a HEP designed to improve strength in left hand independently     Status  Achieved      OT SHORT TERM GOAL #3   Title  Patient will dress lower body with no more than intermittent min assist    Status  Achieved      OT SHORT TERM GOAL #4   Title  Patient will bathe himself in the shower with no more than intermittent min assist    Status  Achieved      OT SHORT TERM GOAL #5   Title  Patient will prepare simple hot meal (using stove top or microwave) for he and his wife with modified independence    Status  Achieved      OT SHORT TERM GOAL #6   Title  Patient will demonstrate improved coordiantion as evidenced by reduced time on 9 hole peg test by 30 seconds - to aide with buttoning, picking up and manipulating small objects, eg, medications    Status  Achieved        OT Long Term Goals - 03/07/18 1616      OT LONG TERM GOAL #1   Title  Patient will complete updated HEP designed to improve range of  motion and strength in left shoulder due 9/14.      Status  On-going      OT LONG TERM GOAL #2   Title  Patient will demonstrate sufficient stand balance and functional use of left arm to reach into high / low cabinets to obtain pots, pans, dishes as needed for cooking tasks    Status  On-going      OT LONG TERM GOAL #3   Title  Patient will be demonstrate sufficient left shoulder range of motion and sustained strength to don deodarant under each arm.      Status  Achieved      OT LONG TERM GOAL #4   Title  Patient will demonstrate sufficient left hand strength and coordination for aide with simple tool use (patient is right hand dominant)     Status  On-going      OT LONG TERM GOAL #5   Title  Patient will demonstrate 15 lb increase in grip strength in left hand to aide with opening jars, bottles, etc.      Status  Achieved   70 lbs!           Plan - 03/09/18 1644    Clinical Impression Statement  Pt  is progressing towards goals with improveing strength and LUE functional use.    Occupational performance deficits (Please refer to evaluation for details):  ADL's;IADL's;Rest and Sleep;Work;Leisure    Rehab Potential  Excellent    OT Frequency  3x / week    OT Duration  8 weeks    OT Treatment/Interventions  Self-care/ADL training;Electrical Stimulation;Therapeutic exercise;Visual/perceptual remediation/compensation;Coping strategies training;Aquatic Therapy;Neuromuscular education;Splinting;Patient/family education;Balance training;Therapeutic activities;Functional Mobility Training;Energy conservation;DME and/or AE instruction;Manual Therapy;Cognitive remediation/compensation    Plan  . NMR LUE - STRENGTHENING - plank to tall plank, push up if able, coordiantion gross/fine motor, functional use of LUE, scapular/trunk control for overhead reach, sustained activity with LUE    Consulted and Agree with Plan of Care  Patient;Family member/caregiver       Patient will benefit from skilled therapeutic intervention in order to improve the following deficits and impairments:  Decreased cognition, Decreased knowledge of use of DME, Impaired flexibility, Impaired vision/preception, Improper body mechanics, Impaired sensation, Decreased mobility, Decreased coordination, Cardiopulmonary status limiting activity, Decreased activity tolerance, Decreased endurance, Decreased range of motion, Decreased strength, Impaired tone, Decreased coping skills, Decreased balance, Difficulty walking, Impaired perceived functional ability, Impaired UE functional use  Visit Diagnosis: Hemiplegia and hemiparesis following cerebral infarction affecting left non-dominant side (HCC)  Muscle weakness (generalized)  Other disturbances of skin sensation  Other lack of coordination    Problem List Patient Active Problem List   Diagnosis Date Noted  . Cerebral embolism with cerebral infarction 02/01/2018  . Stroke  (cerebrum) (HCC) 02/01/2018  . Stroke-like symptoms   . Ataxia 01/31/2018  . Elevated blood pressure reading 01/31/2018  . Asthma     Patte Winkel 03/09/2018, 4:45 PM  [facility] [facility] [address] [place], [place], [address] Phone: [phone]   Fax:  [phone]  Name: Wayne SimmerFrank Richards MRN: [id number] Date of Birth: 09-12-1955

## 2018-03-09 NOTE — Therapy (Signed)
[facility] [address] [place], [place], [address] Phone: [phone]   Fax:  [phone]  Physical Therapy Treatment  Patient Details  Name: Wayne Richards MRN: [id number] Date of Birth: [birth year] Referring Provider: Dr. Donnie Coffin   Encounter Date: 03/09/2018  PT End of Session - 03/09/18 1250    Visit Number  7    Number of Visits  17    Date for PT Re-Evaluation  04/11/18    Authorization Type  Aetna -  VL for PT/OT/ST combined: 90  If seen for all three visits on the same day it constitutes 3 visits    Authorization - Visit Number  17     Authorization - Number of Visits  90    PT Start Time  0805    PT Stop Time  0845    PT Time Calculation (min)  40 min    Activity Tolerance  No increased pain;Patient tolerated treatment well    Behavior During Therapy  [facility] for tasks assessed/performed;Flat affect       Past Medical History:  Diagnosis Date  . Asthma   . Stroke [facility])     Past Surgical History:  Procedure Laterality Date  . ACHILLES TENDON SURGERY      There were no vitals filed for this visit.  Subjective Assessment - 03/09/18 0805    Subjective  My knee is much better. I did my first yoga class yesterday. It was painful but necessary...like going to the dentist. Reports he has not attempted any form of golf--very flat affect with avoiding eye contact when attempting to discuss golf. Still frustrated by the weakness in his LLE, especially when standing and trying to raise his left foot to wash it in shower or to step into his pants.     Pertinent History  ataxia; asthma: HTN: acute Rt pontine infarct:  subacute Lt pontine infarct     Patient Stated Goals  "Regain my normal function"    Currently in Pain?  No/denies         [facility] PT Assessment - 03/09/18 0812      Ambulation/Gait   Gait velocity  32.8/9.31=3.52      Timed Up and Go Test   Normal TUG (seconds)  8.53                    [facility] Adult PT Treatment/Exercise - 03/09/18 8850      Ambulation/Gait   Ambulation/Gait Assistance  6: Modified independent (Device/Increase time)    Ambulation/Gait Assistance Details  slight drift to his left with head turns; recovers independently    Ambulation Distance (Feet)  120 Feet    Assistive device  None    Gait Pattern  Step-through pattern;Decreased arm swing - left;Decreased stance time - left;Decreased dorsiflexion - left;Left foot flat    Ambulation Surface  Level;Indoor      Exercises   Exercises  Knee/Hip      Knee/Hip Exercises: Standing   Hip Flexion  AROM;Stengthening;Left;1 set;10 reps;Knee bent    Step Down  Left;1 set;Hand Hold: 0;Step Height: 6"   for controlled eccentric PF   Other Standing Knee Exercises  8",then 6"step ups backwards with LLE leading and lowering       Knee/Hip Exercises: Seated   Marching  Strengthening;Left;1 set;10 reps      Knee/Hip Exercises: Supine   Straight Leg Raises  Strengthening;Left;1 set;10 reps    Other Supine Knee/Hip Exercises  Leftleg over side  of mat table, right leg bent with foot on mat table; raise left leg with knee flexed towards nose for hip flexor strength x 10              PT Education - 03/09/18 1250    Education Details  additions to HEP    Person(s) Educated  Patient    Methods  Explanation;Demonstration;Handout    Comprehension  Verbalized understanding;Returned demonstration       PT Short Term Goals - 03/09/18 1251      PT SHORT TERM GOAL #1   Title  Improve Berg balance test score by >/= 5 points to reduce fall risk.  Will assess DGI to determine falls risk during gait    Baseline  49/56 - assess DGI at 4 weeks for long term goals; 03/09/18 FGA 29/30     Time  4    Period  Weeks    Status  Achieved      PT SHORT TERM GOAL #2   Title  Improve TUG score from 28.44 secs to </= 23 secs with RW to demo improved mobility.    Baseline  28.44 secs wtih RW; 8.53 sec  no device    Time  4    Period  Weeks    Status  Achieved      PT SHORT TERM GOAL #3   Title  Amb. 100' with SPC with CGA on flat, even surface.    Baseline  no device    Time  4    Period  Weeks    Status  Achieved      PT SHORT TERM GOAL #4   Title  Increase gait velocity from 1.11 ft/sec to >/= 1.5 ft/sec with RW for incr. gait efficiency.    Baseline  29.43 secs = 1.11 ft/sec; 03/09/18 3.52 ft/sec    Time  4    Period  Weeks    Status  Achieved      PT SHORT TERM GOAL #5   Title  Independent in HEP for LLE strengthening.    Time  4    Period  Weeks    Status  Achieved        PT Long Term Goals - 03/09/18 1254      PT LONG TERM GOAL #1   Title  Pt will improve DGI by 4 points to decrease falls risk during gait    Baseline  (to be assessed at 4 weeks if BERG goal is met); FGA 29/30 therefore goal deferred    Time  8    Period  Weeks    Status  Deferred      PT LONG TERM GOAL #2   Title  Improve TUG score from 28.44 secs to </= 18 secs with RW for incr. functional mobility.    Baseline  28.44 secs with RW; 03/09/18 8.53 seconds no device    Time  8    Period  Weeks    Status  Achieved      PT LONG TERM GOAL #3   Title  Pt will be modified independent without device for household ambulation.    Time  8    Period  Weeks    Status  Achieved      PT LONG TERM GOAL #4   Title  Amb. 500' on even/uneven surfaces with SPC for increased community accessibility.      Time  8    Period  Weeks    Status  On-going  PT LONG TERM GOAL #5   Title  Negotiate 4 steps with 1 rail using a step over step sequence.    Baseline  03/09/18 4 steps no rail, step over step sequence    Time  8    Period  Weeks    Status  Achieved            Plan - 03/09/18 1253    Clinical Impression Statement  STGs assessed with pt meeting 5 of 5 goals. Patient has made excellent progress and therefore addressed which LTGs he has also met (pt has met 3 of 5 LTGs). Patient continues with  LLE weakness and can continue to benefit from PT to achieve his goals, however anticipate he will not need the original # of visits requested.     PT Frequency  2x / week    PT Duration  8 weeks    PT Treatment/Interventions  ADLs/Self Care Home Management;Aquatic Therapy;Functional mobility training;Stair training;Gait training;DME Instruction;Therapeutic activities;Therapeutic exercise;Balance training;Neuromuscular re-education;Patient/family education;Passive range of motion;Electrical Stimulation    PT Next Visit Plan  Gait without cane outdoor; NMR LLE (esp hamstrings, quad control). Balance on compliant surfaces. incorporate golf into treatment (if pt open to this)    Consulted and Agree with Plan of Care  Patient       Patient will benefit from skilled therapeutic intervention in order to improve the following deficits and impairments:  Abnormal gait, Decreased activity tolerance, Decreased balance, Decreased coordination, Decreased range of motion, Decreased knowledge of use of DME, Decreased endurance, Decreased strength, Impaired vision/preception, Impaired tone, Impaired sensation, Impaired UE functional use  Visit Diagnosis: Hemiplegia and hemiparesis following cerebral infarction affecting left non-dominant side (HCC)  Muscle weakness (generalized)     Problem List Patient Active Problem List   Diagnosis Date Noted  . Cerebral embolism with cerebral infarction 02/01/2018  . Stroke (cerebrum) ([place]) 02/01/2018  . Stroke-like symptoms   . Ataxia 01/31/2018  . Elevated blood pressure reading 01/31/2018  . Asthma     Rexanne Mano, PT 03/09/2018, 1:10 PM  [facility] [address] [place], [place], [address] Phone: [phone]   Fax:  [phone]  Name: Izaiyah Kleinman MRN: [id number] Date of Birth: 1956-06-26

## 2018-03-09 NOTE — Patient Instructions (Signed)
Access Code: [email or web address]  URL: [email or web address]  Date: 03/09/2018  Prepared by: Veda CanningLynn Anthem Frazer   Exercises  Hip Flexor strengthening - 10 reps - 1-3 sets - 1x daily - 7x weekly  Seated March - 10 reps - 3 sets - 1x daily - 7x weekly

## 2018-03-10 ENCOUNTER — Encounter: Payer: Self-pay | Admitting: Physical Therapy

## 2018-03-10 ENCOUNTER — Ambulatory Visit: Payer: Managed Care, Other (non HMO) | Admitting: Physical Therapy

## 2018-03-10 ENCOUNTER — Ambulatory Visit: Payer: Managed Care, Other (non HMO) | Admitting: Occupational Therapy

## 2018-03-10 DIAGNOSIS — R2689 Other abnormalities of gait and mobility: Secondary | ICD-10-CM

## 2018-03-10 DIAGNOSIS — R2681 Unsteadiness on feet: Secondary | ICD-10-CM

## 2018-03-10 DIAGNOSIS — I69354 Hemiplegia and hemiparesis following cerebral infarction affecting left non-dominant side: Secondary | ICD-10-CM

## 2018-03-10 DIAGNOSIS — R278 Other lack of coordination: Secondary | ICD-10-CM

## 2018-03-10 DIAGNOSIS — R208 Other disturbances of skin sensation: Secondary | ICD-10-CM

## 2018-03-10 DIAGNOSIS — M6281 Muscle weakness (generalized): Secondary | ICD-10-CM

## 2018-03-10 NOTE — Therapy (Signed)
[facility] [address] [place], [place], [address] Phone: [phone]   Fax:  [phone]  Physical Therapy Treatment  Patient Details  Name: Wayne Richards MRN: [id number] Date of Birth: 1955-10-24 Referring Provider: Dr. Donnie Coffin   Encounter Date: 03/10/2018  PT End of Session - 03/10/18 1348    Visit Number  8    Number of Visits  17    Date for PT Re-Evaluation  04/11/18    Authorization Type  Aetna -  VL for PT/OT/ST combined: 90  If seen for all three visits on the same day it constitutes 3 visits    Authorization - Visit Number  19   as of 8/15  (includes today's PT/OT)   Authorization - Number of Visits  90    PT Start Time  1145    PT Stop Time  1230    PT Time Calculation (min)  45 min    Activity Tolerance  Patient tolerated treatment well    Behavior During Therapy  [facility] for tasks assessed/performed       Past Medical History:  Diagnosis Date  . Asthma   . Stroke [facility])     Past Surgical History:  Procedure Laterality Date  . ACHILLES TENDON SURGERY      There were no vitals filed for this visit.  Subjective Assessment - 03/10/18 1144    Subjective  Doing well today, just finished with challenging OT session.  Still would like to work on hip flexion    Pertinent History  ataxia; asthma: HTN: acute Rt pontine infarct:  subacute Lt pontine infarct     Patient Stated Goals  "Regain my normal function"    Currently in Pain?  No/denies                       [facility] Adult PT Treatment/Exercise - 03/10/18 1323      Ambulation/Gait   Ambulation/Gait  Yes    Ambulation/Gait Assistance  6: Modified independent (Device/Increase time)    Ambulation/Gait Assistance Details  with use of Bioness on L hamstring in gait mode for increased knee flexion during swing phase and increased knee control during stance; pt able to perform jogging around gym x 3 laps with Bioness due to improved  activation and knee control in stance    Ambulation Distance (Feet)  575 Feet    Assistive device  None    Gait Pattern  Step-through pattern;Decreased hip/knee flexion - left    Ambulation Surface  Level;Indoor      Exercises   Exercises  Knee/Hip      Knee/Hip Exercises: Supine   Knee Flexion  Strengthening;Left;Right;2 sets;10 reps    Knee Flexion Limitations  with bilat feet on therapy ball performing hamstring curls bilaterally and then alternating single leg with contralateral LE straight leg raise with Bioness in training mode    Other Supine Knee/Hip Exercises  2 sets x 10 reps bent knee hip flexion: LLE off side of mat -cued to activation hip and knee flexion to bring knee to chest with exhalation.  2nd set performed against resistance of red theraband with cues for controlled lowering for eccentric control      Knee/Hip Exercises: Prone   Hamstring Curl  2 sets;10 reps    Hamstring Curl Limitations  with Bioness in training mode and use of exhalation      Modalities   Modalities  Electrical Stimulation      Electrical  Stimulation   Electrical Stimulation Location  L anterior tib for use of gyroscope in EPG only (no stimulation); L hamstring    Electrical Stimulation Action  L hip extension and knee flexion    Electrical Stimulation Parameters  See saved parameters in tablet 1    Electrical Stimulation Goals  Strength;Neuromuscular facilitation             PT Education - 03/10/18 1346    Education Details  use of Bioness for NMR    Person(s) Educated  Patient    Methods  Explanation    Comprehension  Verbalized understanding       PT Short Term Goals - 03/09/18 1251      PT SHORT TERM GOAL #1   Title  Improve Berg balance test score by >/= 5 points to reduce fall risk.  Will assess DGI to determine falls risk during gait    Baseline  49/56 - assess DGI at 4 weeks for long term goals; 03/09/18 FGA 29/30     Time  4    Period  Weeks    Status  Achieved       PT SHORT TERM GOAL #2   Title  Improve TUG score from 28.44 secs to </= 23 secs with RW to demo improved mobility.    Baseline  28.44 secs wtih RW; 8.53 sec no device    Time  4    Period  Weeks    Status  Achieved      PT SHORT TERM GOAL #3   Title  Amb. 100' with SPC with CGA on flat, even surface.    Baseline  no device    Time  4    Period  Weeks    Status  Achieved      PT SHORT TERM GOAL #4   Title  Increase gait velocity from 1.11 ft/sec to >/= 1.5 ft/sec with RW for incr. gait efficiency.    Baseline  29.43 secs = 1.11 ft/sec; 03/09/18 3.52 ft/sec    Time  4    Period  Weeks    Status  Achieved      PT SHORT TERM GOAL #5   Title  Independent in HEP for LLE strengthening.    Time  4    Period  Weeks    Status  Achieved        PT Long Term Goals - 03/09/18 1254      PT LONG TERM GOAL #1   Title  Pt will improve DGI by 4 points to decrease falls risk during gait    Baseline  (to be assessed at 4 weeks if BERG goal is met); FGA 29/30 therefore goal deferred    Time  8    Period  Weeks    Status  Deferred      PT LONG TERM GOAL #2   Title  Improve TUG score from 28.44 secs to </= 18 secs with RW for incr. functional mobility.    Baseline  28.44 secs with RW; 03/09/18 8.53 seconds no device    Time  8    Period  Weeks    Status  Achieved      PT LONG TERM GOAL #3   Title  Pt will be modified independent without device for household ambulation.    Time  8    Period  Weeks    Status  Achieved      PT LONG TERM GOAL #4   Title  Amb. 500' on even/uneven surfaces with [facility] for increased community accessibility.      Time  8    Period  Weeks    Status  On-going      PT LONG TERM GOAL #5   Title  Negotiate 4 steps with 1 rail using a step over step sequence.    Baseline  03/09/18 4 steps no rail, step over step sequence    Time  8    Period  Weeks    Status  Achieved            Plan - 03/10/18 1349    Clinical Impression Statement  Continued to address  strengthening, motor planning of LLE for hip and knee flexion with use of breathing, resistance and use of functional electrical stimulation (Bioness).  Pt able to demonstrate improved, sustained activation and control with Bioness and with exhalation during activation.  Pt demonstrated improved sequencing with gait and was able to initiate jogging at slow speed around gym.  Pt very pleased with progress today and would like to keep working with [place].  Will continue to progress towards LTG.    PT Frequency  2x / week    PT Duration  8 weeks    PT Treatment/Interventions  ADLs/Self Care Home Management;Aquatic Therapy;Functional mobility training;Stair training;Gait training;DME Instruction;Therapeutic activities;Therapeutic exercise;Balance training;Neuromuscular re-education;Patient/family education;Passive range of motion;Electrical Stimulation    PT Next Visit Plan  Use of Bioness on L hamstring (lower cuff on pt but not stimulating) for NMR and gait training/jogging.  Gait without cane outdoor; NMR LLE (esp hamstrings, quad control). Balance on compliant surfaces. incorporate golf into treatment (if pt open to this)    Consulted and Agree with Plan of Care  Patient       Patient will benefit from skilled therapeutic intervention in order to improve the following deficits and impairments:  Abnormal gait, Decreased activity tolerance, Decreased balance, Decreased coordination, Decreased range of motion, Decreased knowledge of use of DME, Decreased endurance, Decreased strength, Impaired vision/preception, Impaired tone, Impaired sensation, Impaired UE functional use  Visit Diagnosis: Hemiplegia and hemiparesis following cerebral infarction affecting left non-dominant side (HCC)  Other abnormalities of gait and mobility  Muscle weakness (generalized)  Unsteadiness on feet     Problem List Patient Active Problem List   Diagnosis Date Noted  . Cerebral embolism with cerebral infarction  02/01/2018  . Stroke (cerebrum) ([place]) 02/01/2018  . Stroke-like symptoms   . Ataxia 01/31/2018  . Elevated blood pressure reading 01/31/2018  . Asthma     Rico Junker, PT, DPT 03/10/18    3:20 PM    [facility] [address] [place], [place], [address] Phone: [phone]   Fax:  [phone]  Name: Wayne Richards MRN: [id number] Date of Birth: 07-01-1956

## 2018-03-10 NOTE — Progress Notes (Signed)
I agree with the above plan 

## 2018-03-11 NOTE — Therapy (Signed)
[facility] [address] [place], [place], [address] Phone: [phone]   Fax:  [phone]  Occupational Therapy Treatment  Patient Details  Name: Wayne Richards MRN: [id number] Date of Birth: [birth year] Referring Provider: Dr. Lupe Carneyean Mitchell   Encounter Date: 03/10/2018  OT End of Session - 03/10/18 1158    Visit Number  13    Number of Visits  25    Date for OT Re-Evaluation  04/09/18    Authorization Type  [employer]- 90 VL for all three disciplines combined    OT Start Time  1105    OT Stop Time  1143    OT Time Calculation (min)  38 min    Activity Tolerance  Patient tolerated treatment well    Behavior During Therapy  [facility] assessed/performed       Past Medical History:  Diagnosis Date  . Asthma   . Stroke [facility](HCC)     Past Surgical History:  Procedure Laterality Date  . ACHILLES TENDON SURGERY      There were no vitals filed for this visit.  Subjective Assessment - 03/11/18 1059    Pertinent History  hypertension, vertigo, hyperlipidemia- now with diagnosed subacute Left pons infarct, and acute right pons infarct    Currently in Pain?  No/denies           Treatment: Weightbearing through bilateral UE's edge of mat , bridge x 5 while tapping foot on chair in front for core stability/ UE strength, Plank x 5 reps holding for 10-15 secs, min v.c,  Bird dog position lifting alternate UE/LE, min facilitation/ v.c for postioning for core stability/ UE strength. Pushups x 10 reps for increased UE strength, Rolling weighted ball up vertical surface for UE strength and control with LUE 5 reps, min v.c Dribbling ball while ambualting for increased LUE timing and control, min v.c, tossing ball between hands while ambulating.                  OT Short Term Goals - 03/11/18 1102      OT SHORT TERM GOAL #1   Title  Patient will complete a HEP designed to improve coord in left hand  independently due 03/10/18    Status  Achieved      OT SHORT TERM GOAL #2   Title  Patient will complete a HEP designed to improve strength in left hand independently     Status  Achieved      OT SHORT TERM GOAL #3   Title  Patient will dress lower body with no more than intermittent min assist    Status  Achieved      OT SHORT TERM GOAL #4   Title  Patient will bathe himself in the shower with no more than intermittent min assist    Status  Achieved      OT SHORT TERM GOAL #5   Title  Patient will prepare simple hot meal (using stove top or microwave) for he and his wife with modified independence    Status  Achieved      OT SHORT TERM GOAL #6   Title  Patient will demonstrate improved coordiantion as evidenced by reduced time on 9 hole peg test by 30 seconds - to aide with buttoning, picking up and manipulating small objects, eg, medications    Status  Achieved        OT Long Term Goals - 03/11/18 1103      OT LONG  TERM GOAL #1   Title  Patient will complete updated HEP designed to improve range of motion and strength in left shoulder due 9/14.      Status  On-going      OT LONG TERM GOAL #2   Title  Patient will demonstrate sufficient stand balance and functional use of left arm to reach into high / low cabinets to obtain pots, pans, dishes as needed for cooking tasks    Status  On-going      OT LONG TERM GOAL #3   Title  Patient will be demonstrate sufficient left shoulder range of motion and sustained strength to don deodarant under each arm.      Status  Achieved      OT LONG TERM GOAL #4   Title  Patient will demonstrate sufficient left hand strength and coordination for aide with simple tool use (patient is right hand dominant)     Status  On-going      OT LONG TERM GOAL #5   Title  Patient will demonstrate 15 lb increase in grip strength in left hand to aide with opening jars, bottles, etc.      Status  Achieved   70 lbs!           Plan - 03/11/18 1100     Clinical Impression Statement  Pt is progressing towards goals. He demonstrates improved LUE functional use. Pt participated in yoga prior to therapy today.    Occupational Profile and client history currently impacting functional performance  Husband, father of 34- youngest is 6519, retired Emergency planning/management officerpolice officer, Teacher, English as a foreign languagegolfer, Production designer, theatre/television/filmmanager of household    Occupational performance deficits (Please refer to evaluation for details):  ADL's;IADL's;Rest and Sleep;Work;Leisure    Rehab Potential  Excellent    OT Frequency  3x / week    OT Duration  8 weeks    OT Treatment/Interventions  Self-care/ADL training;Electrical Stimulation;Therapeutic exercise;Visual/perceptual remediation/compensation;Coping strategies training;Aquatic Therapy;Neuromuscular education;Splinting;Patient/family education;Balance training;Therapeutic activities;Functional Mobility Training;Energy conservation;DME and/or AE instruction;Manual Therapy;Cognitive remediation/compensation    Plan  . NMR LUE - STRENGTHENING , overhead reach, sustained activity with LUE    Consulted and Agree with Plan of Care  Patient;Family member/caregiver    Family Member Consulted  sister Tommye StandardMadonna Nash       Patient will benefit from skilled therapeutic intervention in order to improve the following deficits and impairments:  Decreased cognition, Decreased knowledge of use of DME, Impaired flexibility, Impaired vision/preception, Improper body mechanics, Impaired sensation, Decreased mobility, Decreased coordination, Cardiopulmonary status limiting activity, Decreased activity tolerance, Decreased endurance, Decreased range of motion, Decreased strength, Impaired tone, Decreased coping skills, Decreased balance, Difficulty walking, Impaired perceived functional ability, Impaired UE functional use  Visit Diagnosis: Hemiplegia and hemiparesis following cerebral infarction affecting left non-dominant side (HCC)  Muscle weakness (generalized)  Other disturbances of skin  sensation  Other lack of coordination    Problem List Patient Active Problem List   Diagnosis Date Noted  . Cerebral embolism with cerebral infarction 02/01/2018  . 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 Stroke-like symptoms   . Ataxia 01/31/2018  . Elevated blood pressure reading 01/31/2018  . Asthma     Aliani Caccavale 03/11/2018, 11:04 AM  [facility] [address] [place], [place], [address] Phone: [phone]   Fax:  [phone]  Name: Wayne SimmerFrank Duhart MRN: [id number] Date of Birth: February 05, 1956

## 2018-03-14 ENCOUNTER — Ambulatory Visit: Payer: Managed Care, Other (non HMO) | Admitting: Occupational Therapy

## 2018-03-14 ENCOUNTER — Encounter: Payer: Self-pay | Admitting: Occupational Therapy

## 2018-03-14 DIAGNOSIS — R2681 Unsteadiness on feet: Secondary | ICD-10-CM

## 2018-03-14 DIAGNOSIS — R270 Ataxia, unspecified: Secondary | ICD-10-CM

## 2018-03-14 DIAGNOSIS — R208 Other disturbances of skin sensation: Secondary | ICD-10-CM

## 2018-03-14 DIAGNOSIS — M6281 Muscle weakness (generalized): Secondary | ICD-10-CM

## 2018-03-14 DIAGNOSIS — I69354 Hemiplegia and hemiparesis following cerebral infarction affecting left non-dominant side: Secondary | ICD-10-CM | POA: Diagnosis not present

## 2018-03-14 DIAGNOSIS — R278 Other lack of coordination: Secondary | ICD-10-CM

## 2018-03-14 DIAGNOSIS — I69315 Cognitive social or emotional deficit following cerebral infarction: Secondary | ICD-10-CM

## 2018-03-14 NOTE — Therapy (Signed)
[facility] [address] [place], [place], [address] Phone: [phone]   Fax:  [phone]  Occupational Therapy Treatment  Patient Details  Name: Wayne SimmerFrank Richards MRN: [id number] Date of Birth: 1955/12/09 Referring Provider: Dr. Lupe Carneyean Mitchell   Encounter Date: 03/14/2018  OT End of Session - 03/14/18 1207    Visit Number  14    Number of Visits  25    Date for OT Re-Evaluation  04/09/18    Authorization Type  [employer]- 90 VL for all three disciplines combined    OT Start Time  0932    OT Stop Time  1016    OT Time Calculation (min)  44 min    Activity Tolerance  Patient tolerated treatment well       Past Medical History:  Diagnosis Date  . Asthma   . Stroke [facility](HCC)     Past Surgical History:  Procedure Laterality Date  . ACHILLES TENDON SURGERY      There were no vitals filed for this visit.  Subjective Assessment - 03/14/18 0935    Subjective   My arm is definitely working better but its hard to coordinate it with my other arm (i.e. using long handled brush to wash car).    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  Yes    Pain Score  6     Pain Location  Knee    Pain Orientation  Right    Pain Descriptors / Indicators  Tender    Pain Type  Chronic pain    Pain Onset  In the past 7 days    Pain Frequency  Intermittent    Aggravating Factors   when I exert energy on the leg (not in sitting or laying down), tender to touch    Pain Relieving Factors  Ice, OTC, - it helps some.                    OT Treatments/Exercises (OP) - 03/14/18 0001      Exercises   Exercises  Hand      Hand Exercises   Other Hand Exercises  Pt reports that at times he has diffculty holding onto fork when trying to cut meat.  Assessed pinch strength:  R 2 pt= 16, L 2pt= 5, R 3 pt = 18, L 3pt = 11, R  lateral = 26, L lateral = 20. Issued blue putty to address pinch strength. Pt able to return demonstrate after instruction. Grip strength today for L = 105      Neurological Re-education Exercises   Other Exercises 1  Neuro re ed in sidelying to address scapular stability and ER using 2 pound weight and min facilitation for good form and alignment. Transitioned into supine and addressed lateral abdominals with UE mid reach across body using 3 pound weight and alternating sides.  Also addressed lower trunk initiated movement for lateral flexion/extension in supine with LE's on moveable surface. Transitioned into sitting and addressed resisted shortening with rotation on L side of trunk. Pt with improved alignment and activation by end of session.       Manual Therapy   Manual Therapy  Scapular mobilization;Soft tissue mobilization    Manual therapy comments  soft tissue, scap mob to address alignment prior to activation - scapula is abducted, eleveated and upwardly rotated and pt with signficant tightness in levators  as well as upper trapezius. This is in part coming from decreased trunk alignment and activation (pt is elongated on L side with increased weight bearing in sitting).               OT Education - 03/14/18 1203    Education Details  blue putty for pinch strength    Person(s) Educated  Patient    Methods  Explanation;Demonstration    Comprehension  Verbalized understanding;Returned demonstration       OT Short Term Goals - 03/14/18 1204      OT SHORT TERM GOAL #1   Title  Patient will complete a HEP designed to improve coord in left hand independently due 03/10/18    Status  Achieved      OT SHORT TERM GOAL #2   Title  Patient will complete a HEP designed to improve strength in left hand independently     Status  Achieved      OT SHORT TERM GOAL #3   Title  Patient will dress lower body with no more than intermittent min assist    Status  Achieved      OT SHORT TERM  GOAL #4   Title  Patient will bathe himself in the shower with no more than intermittent min assist    Status  Achieved      OT SHORT TERM GOAL #5   Title  Patient will prepare simple hot meal (using stove top or microwave) for he and his wife with modified independence    Status  Achieved      OT SHORT TERM GOAL #6   Title  Patient will demonstrate improved coordiantion as evidenced by reduced time on 9 hole peg test by 30 seconds - to aide with buttoning, picking up and manipulating small objects, eg, medications    Status  Achieved        OT Long Term Goals - 03/14/18 1204      OT LONG TERM GOAL #1   Title  Patient will complete updated HEP designed to improve range of motion and strength in left shoulder due 9/14.      Status  On-going      OT LONG TERM GOAL #2   Title  Patient will demonstrate sufficient stand balance and functional use of left arm to reach into high / low cabinets to obtain pots, pans, dishes as needed for cooking tasks    Status  Achieved      OT LONG TERM GOAL #3   Title  Patient will be demonstrate sufficient left shoulder range of motion and sustained strength to don deodarant under each arm.      Status  Achieved      OT LONG TERM GOAL #4   Title  Patient will demonstrate sufficient left hand strength and coordination for aide with simple tool use (patient is right hand dominant)     Status  On-going      OT LONG TERM GOAL #5   Title  Patient will demonstrate 15 lb increase in grip strength in left hand to aide with opening jars, bottles, etc.      Status  Achieved   03/14/2018  105 pounds           Plan - 03/14/18 1205    Clinical Impression Statement  Pt progressing toward goals - pt with improving functional use of LUE as well as strength.    Occupational Profile and client history currently impacting functional performance  Husband, father of 204- youngest is 419, retired Emergency planning/management officerpolice officer, Teacher, English as a foreign languagegolfer, Production designer, theatre/television/filmmanager of household    Occupational  performance deficits (Please refer to evaluation for details):  ADL's;IADL's;Rest and Sleep;Work;Leisure    Rehab Potential  Excellent    Current Impairments/barriers affecting progress:  patient still under MD care to determine if stroke is stable - F/U CT scheduled this week.  F/U CT completed 7/18     OT Frequency  3x / week    OT Duration  8 weeks    OT Treatment/Interventions  Self-care/ADL training;Electrical Stimulation;Therapeutic exercise;Visual/perceptual remediation/compensation;Coping strategies training;Aquatic Therapy;Neuromuscular education;Splinting;Patient/family education;Balance training;Therapeutic activities;Functional Mobility Training;Energy conservation;DME and/or AE instruction;Manual Therapy;Cognitive remediation/compensation    Plan  . NMR LUE/TRUNK!!!  -PROXIMAL stable mobility for LUE/trunk , manual if needed to address alignment prior to activation, overhead reach, sustained activity with LUE, bilateral use of both UE's    Consulted and Agree with Plan of Care  Patient       Patient will benefit from skilled therapeutic intervention in order to improve the following deficits and impairments:  Decreased cognition, Decreased knowledge of use of DME, Impaired flexibility, Impaired vision/preception, Improper body mechanics, Impaired sensation, Decreased mobility, Decreased coordination, Cardiopulmonary status limiting activity, Decreased activity tolerance, Decreased endurance, Decreased range of motion, Decreased strength, Impaired tone, Decreased coping skills, Decreased balance, Difficulty walking, Impaired perceived functional ability, Impaired UE functional use  Visit Diagnosis: Hemiplegia and hemiparesis following cerebral infarction affecting left non-dominant side (HCC)  Muscle weakness (generalized)  Unsteadiness on feet  Other disturbances of skin sensation  Other lack of coordination  Cognitive social or emotional deficit following cerebral  infarction  Ataxia    Problem List Patient Active Problem List   Diagnosis Date Noted  . Cerebral embolism with cerebral infarction 02/01/2018  . 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 Stroke-like symptoms   . Ataxia 01/31/2018  . Elevated blood pressure reading 01/31/2018  . Asthma     Norton Pastelulaski, Karen Halliday, OTR/L 03/14/2018, 12:08 PM  [facility] [address] [place], [place], [address] Phone: [phone]   Fax:  [phone]  Name: Wayne SimmerFrank Richards MRN: [id number] Date of Birth: 01/18/1956

## 2018-03-16 ENCOUNTER — Ambulatory Visit: Payer: Managed Care, Other (non HMO) | Admitting: Physical Therapy

## 2018-03-16 ENCOUNTER — Encounter: Payer: Self-pay | Admitting: Occupational Therapy

## 2018-03-16 ENCOUNTER — Encounter: Payer: Self-pay | Admitting: Physical Therapy

## 2018-03-16 ENCOUNTER — Ambulatory Visit: Payer: Managed Care, Other (non HMO) | Admitting: Occupational Therapy

## 2018-03-16 DIAGNOSIS — R278 Other lack of coordination: Secondary | ICD-10-CM

## 2018-03-16 DIAGNOSIS — I69354 Hemiplegia and hemiparesis following cerebral infarction affecting left non-dominant side: Secondary | ICD-10-CM | POA: Diagnosis not present

## 2018-03-16 DIAGNOSIS — R2681 Unsteadiness on feet: Secondary | ICD-10-CM

## 2018-03-16 DIAGNOSIS — M6281 Muscle weakness (generalized): Secondary | ICD-10-CM

## 2018-03-16 DIAGNOSIS — R2689 Other abnormalities of gait and mobility: Secondary | ICD-10-CM

## 2018-03-16 DIAGNOSIS — R208 Other disturbances of skin sensation: Secondary | ICD-10-CM

## 2018-03-16 NOTE — Therapy (Signed)
[facility] [address] [place], [place], [address] Phone: [phone]   Fax:  [phone]  Physical Therapy Treatment  Patient Details  Name: Wayne Richards MRN: [id number] Date of Birth: Sep 09, 1955 Referring Provider: Dr. Donnie Coffin   Encounter Date: 03/16/2018  PT End of Session - 03/16/18 1438    Visit Number  9    Number of Visits  17    Date for PT Re-Evaluation  04/11/18    Authorization Type  [employer] -  VL for PT/OT/ST combined: 90  If seen for all three visits on the same day it constitutes 3 visits    Authorization - Visit Number  22   as of 8/21 (includes today's PT/OT)   Authorization - Number of Visits  90    PT Start Time  1017    PT Stop Time  1100    PT Time Calculation (min)  43 min    Activity Tolerance  Other (comment)   frustrated about fall and R knee   Behavior During Therapy  [facility] for tasks assessed/performed       Past Medical History:  Diagnosis Date  . Asthma   . Stroke [facility])     Past Surgical History:  Procedure Laterality Date  . ACHILLES TENDON SURGERY      There were no vitals filed for this visit.  Subjective Assessment - 03/16/18 1023    Subjective  Pt tearful and frustrated today.  Had a fall this weekend: was sitting edge of bed and reaching under bed.  [place] forwards and hit his knee hard.  Now having swelling pre-patellar.  Has used ice and KT tape for edema.  Pt feels that has been a big set back and is upset.    (Pended)     Pertinent History  ataxia; asthma: HTN: acute Rt pontine infarct:  subacute Lt pontine infarct   (Pended)     Patient Stated Goals  "Regain my normal function"  (Pended)     Currently in Pain?  Yes  (Pended)                        [facility] Adult PT Treatment/Exercise - 03/16/18 1433      Therapeutic Activites    Therapeutic Activities  Other Therapeutic Activities    Other Therapeutic Activities  symptoms of bursitis and use of ICE  and rest to treat R knee.        Neuro Re-ed    Neuro Re-ed Details   Performed NMR with use of Bioness in training mode on LLE to facilitate L trunk shortening and improve ability to flex, ER L hip and flex knee for dressing tasks.  Began in R sidelying with focus on isolated L lateral trunk shortening and then adding in LLE lift and knee flexion (knee to chest).  Also performed isolated hip ER and then incorporated hip ER into L trunk shortening and hip/knee flexion.  Attempted these movements in sidelying propped up on elbow but pt began to compensate with shoulder elevation.  Transitioned to sitting and focused on use of posterior pelvic tilt with hip ER, flexion and knee flexion to bring LLE up high enough for RUE to reach across and tap foot.  Performed x 3 reps with Bioness in training mode      Acupuncturist Location  L anterior tib for use of gyroscope in EPG only (no stimulation); L hamstring  Electrical Stimulation Action  L hip extension and knee flexion    Electrical Stimulation Parameters  See saved parameters in tablet 1    Electrical Stimulation Goals  Strength;Neuromuscular facilitation             PT Education - 03/16/18 1438    Education Details  NMR for L trunk shortening    Person(s) Educated  Patient    Methods  Explanation    Comprehension  Verbalized understanding       PT Short Term Goals - 03/09/18 1251      PT SHORT TERM GOAL #1   Title  Improve Berg balance test score by >/= 5 points to reduce fall risk.  Will assess DGI to determine falls risk during gait    Baseline  49/56 - assess DGI at 4 weeks for long term goals; 03/09/18 FGA 29/30     Time  4    Period  Weeks    Status  Achieved      PT SHORT TERM GOAL #2   Title  Improve TUG score from 28.44 secs to </= 23 secs with RW to demo improved mobility.    Baseline  28.44 secs wtih RW; 8.53 sec no device    Time  4    Period  Weeks    Status  Achieved      PT  SHORT TERM GOAL #3   Title  Amb. 100' with SPC with CGA on flat, even surface.    Baseline  no device    Time  4    Period  Weeks    Status  Achieved      PT SHORT TERM GOAL #4   Title  Increase gait velocity from 1.11 ft/sec to >/= 1.5 ft/sec with RW for incr. gait efficiency.    Baseline  29.43 secs = 1.11 ft/sec; 03/09/18 3.52 ft/sec    Time  4    Period  Weeks    Status  Achieved      PT SHORT TERM GOAL #5   Title  Independent in HEP for LLE strengthening.    Time  4    Period  Weeks    Status  Achieved        PT Long Term Goals - 03/09/18 1254      PT LONG TERM GOAL #1   Title  Pt will improve DGI by 4 points to decrease falls risk during gait    Baseline  (to be assessed at 4 weeks if BERG goal is met); FGA 29/30 therefore goal deferred    Time  8    Period  Weeks    Status  Deferred      PT LONG TERM GOAL #2   Title  Improve TUG score from 28.44 secs to </= 18 secs with RW for incr. functional mobility.    Baseline  28.44 secs with RW; 03/09/18 8.53 seconds no device    Time  8    Period  Weeks    Status  Achieved      PT LONG TERM GOAL #3   Title  Pt will be modified independent without device for household ambulation.    Time  8    Period  Weeks    Status  Achieved      PT LONG TERM GOAL #4   Title  Amb. 500' on even/uneven surfaces with SPC for increased community accessibility.      Time  8    Period  Weeks    Status  On-going      PT LONG TERM GOAL #5   Title  Negotiate 4 steps with 1 rail using a step over step sequence.    Baseline  03/09/18 4 steps no rail, step over step sequence    Time  8    Period  Weeks    Status  Achieved            Plan - 03/16/18 1439    Clinical Impression Statement  Continued NMR with use of Bioness on LLE for L trunk shortening, posterior pelvic tilt and hip ER, flexion with knee flexion to improve sequencing to improve independence with ADL at home.  By end of session pt demonstrated significantly increased ROM  in sitting but continues to utilize shoulder elevation to compensate for L weakness.  Will continue to address in order to progress towards LTG.  Will also continue to monitor R knee.    PT Frequency  2x / week    PT Duration  8 weeks    PT Treatment/Interventions  ADLs/Self Care Home Management;Aquatic Therapy;Functional mobility training;Stair training;Gait training;DME Instruction;Therapeutic activities;Therapeutic exercise;Balance training;Neuromuscular re-education;Patient/family education;Passive range of motion;Electrical Stimulation    PT Next Visit Plan  Use of Bioness on L hamstring (lower cuff on pt but not stimulating) for NMR and gait training/jogging.  Gait without cane outdoor; NMR LLE (esp hamstrings, quad control). Balance on compliant surfaces. incorporate golf into treatment (if pt open to this)    Consulted and Agree with Plan of Care  Patient       Patient will benefit from skilled therapeutic intervention in order to improve the following deficits and impairments:  Abnormal gait, Decreased activity tolerance, Decreased balance, Decreased coordination, Decreased range of motion, Decreased knowledge of use of DME, Decreased endurance, Decreased strength, Impaired vision/preception, Impaired tone, Impaired sensation, Impaired UE functional use  Visit Diagnosis: Hemiplegia and hemiparesis following cerebral infarction affecting left non-dominant side (HCC)  Muscle weakness (generalized)  Unsteadiness on feet  Other abnormalities of gait and mobility     Problem List Patient Active Problem List   Diagnosis Date Noted  . Cerebral embolism with cerebral infarction 02/01/2018  . Stroke (cerebrum) ([place]) 02/01/2018  . Stroke-like symptoms   . Ataxia 01/31/2018  . Elevated blood pressure reading 01/31/2018  . Asthma     Rico Junker, PT, DPT 03/16/18    2:43 PM    [facility] [address] [place] [address] [place], [place], [address] Phone: [phone]   Fax:  [phone]  Name: Vedansh Kerstetter MRN: [id number] Date of Birth: November 06, 1955

## 2018-03-16 NOTE — Therapy (Signed)
[facility] [address] [place], [place], [address] Phone: [phone]   Fax:  [phone]  Occupational Therapy Treatment  Patient Details  Name: Wayne Richards MRN: [id number] Date of Birth: [birth year] Referring Provider: Dr. Lupe Carney   Encounter Date: 03/16/2018  OT End of Session - 03/16/18 1201    Visit Number  15    Number of Visits  25    Date for OT Re-Evaluation  04/09/18    Authorization Type  [employer]- 90 VL for all three disciplines combined    OT Start Time  1100    OT Stop Time  1140    OT Time Calculation (min)  40 min    Activity Tolerance  Patient tolerated treatment well    Behavior During Therapy  [facility] for tasks assessed/performed       Past Medical History:  Diagnosis Date  . Asthma   . Stroke [facility])     Past Surgical History:  Procedure Laterality Date  . ACHILLES TENDON SURGERY      There were no vitals filed for this visit.  Subjective Assessment - 03/16/18 1102    Subjective   I had to put my daughters bed together yesterday.      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  Yes    Pain Score  6     Pain Location  Knee    Pain Orientation  Right    Pain Descriptors / Indicators  Tender;Throbbing    Pain Onset  In the past 7 days    Pain Frequency  Constant    Aggravating Factors   touch, pressure    Pain Relieving Factors  taping    Effect of Pain on Daily Activities  t                   OT Treatments/Exercises (OP) - 03/16/18 0001      Neurological Re-education Exercises   Other Exercises 1  Neurological reeducation to address core activation specifically to address trunk shortening and lengthening, and lower trunk / lower body initiated weight shifts. Patient still has fluid on right knee from fall this weekend, and unale to tolerate kneeling.  Worked  seated on physioball on lateral weight shifts, anterior  / posterior weight shifts seated.  Added upper extremities to introduce rotational components to motion.  Attempted PNF diagonals( whole body) with weighted ball - ibliateral pattern while seated on ball.  Patient able to stabilize ball with LLE, to lift RLE, but unable to lift LLE sufficiently while seated on physioball.      Other Exercises 2  Standing modified push up with UE's on unstable surface.  Worked to [employer] one arm, and stabilize with other.  Min assist needed to stabilize on left.  Added trunk flexion / elongation with rotational components.                 OT Short Term Goals - 03/14/18 1204      OT SHORT TERM GOAL #1   Title  Patient will complete a HEP designed to improve coord in left hand independently due 03/10/18    Status  Achieved      OT SHORT TERM GOAL #2   Title  Patient will complete a HEP designed to improve strength in left hand independently     Status  Achieved  OT SHORT TERM GOAL #3   Title  Patient will dress lower body with no more than intermittent min assist    Status  Achieved      OT SHORT TERM GOAL #4   Title  Patient will bathe himself in the shower with no more than intermittent min assist    Status  Achieved      OT SHORT TERM GOAL #5   Title  Patient will prepare simple hot meal (using stove top or microwave) for he and his wife with modified independence    Status  Achieved      OT SHORT TERM GOAL #6   Title  Patient will demonstrate improved coordiantion as evidenced by reduced time on 9 hole peg test by 30 seconds - to aide with buttoning, picking up and manipulating small objects, eg, medications    Status  Achieved        OT Long Term Goals - 03/16/18 1202      OT LONG TERM GOAL #1   Title  Patient will complete updated HEP designed to improve range of motion and strength in left shoulder due 9/14.      Status  On-going      OT LONG TERM GOAL #2   Title   Patient will demonstrate sufficient stand balance and functional use of left arm to reach into high / low cabinets to obtain pots, pans, dishes as needed for cooking tasks    Status  Achieved      OT LONG TERM GOAL #3   Title  Patient will be demonstrate sufficient left shoulder range of motion and sustained strength to don deodarant under each arm.      Status  Achieved      OT LONG TERM GOAL #4   Title  Patient will demonstrate sufficient left hand strength and coordination for aide with simple tool use (patient is right hand dominant)     Status  On-going      OT LONG TERM GOAL #5   Title  Patient will demonstrate 15 lb increase in grip strength in left hand to aide with opening jars, bottles, etc.      Status  Achieved            Plan - 03/16/18 1201    Clinical Impression Statement  Patient continues to show excellent improvement with functional use of LUE, and functional mobility.      Occupational Profile and client history currently impacting functional performance  Husband, father of 794- youngest is 219, retired Emergency planning/management officerpolice officer, Teacher, English as a foreign languagegolfer, Production designer, theatre/television/filmmanager of household    Occupational performance deficits (Please refer to evaluation for details):  ADL's;IADL's;Rest and Sleep;Work;Leisure    Rehab Potential  Excellent    OT Frequency  3x / week    OT Duration  8 weeks    OT Treatment/Interventions  Self-care/ADL training;Electrical Stimulation;Therapeutic exercise;Visual/perceptual remediation/compensation;Coping strategies training;Aquatic Therapy;Neuromuscular education;Splinting;Patient/family education;Balance training;Therapeutic activities;Functional Mobility Training;Energy conservation;DME and/or AE instruction;Manual Therapy;Cognitive remediation/compensation    Plan  . NMR LUE/TRUNK!!!  -PROXIMAL stable mobility for LUE/trunk , manual if needed to address alignment prior to activation, overhead reach, sustained activity with LUE, bilateral use of both UE's    Clinical Decision  Making  Several treatment options, min-mod task modification necessary    Consulted and Agree with Plan of Care  Patient       Patient will benefit from skilled therapeutic intervention in order to improve the following deficits and impairments:  Decreased cognition, Decreased knowledge of use  of DME, Impaired flexibility, Impaired vision/preception, Improper body mechanics, Impaired sensation, Decreased mobility, Decreased coordination, Cardiopulmonary status limiting activity, Decreased activity tolerance, Decreased endurance, Decreased range of motion, Decreased strength, Impaired tone, Decreased coping skills, Decreased balance, Difficulty walking, Impaired perceived functional ability, Impaired UE functional use  Visit Diagnosis: Hemiplegia and hemiparesis following cerebral infarction affecting left non-dominant side (HCC)  Muscle weakness (generalized)  Unsteadiness on feet  Other disturbances of skin sensation  Other lack of coordination    Problem List Patient Active Problem List   Diagnosis Date Noted  . Cerebral embolism with cerebral infarction 02/01/2018  . 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 Stroke-like symptoms   . Ataxia 01/31/2018  . Elevated blood pressure reading 01/31/2018  . Asthma     Collier SalinaGellert, Lennix Rotundo M, OTR/L 03/16/2018, 12:03 PM  [facility] [address] [place], [place], [address] Phone: [phone]   Fax:  [phone]  Name: Nilda SimmerFrank Vanrossum MRN: [id number] Date of Birth: Apr 27, 1956

## 2018-03-17 ENCOUNTER — Encounter: Payer: Self-pay | Admitting: Occupational Therapy

## 2018-03-17 ENCOUNTER — Ambulatory Visit: Payer: Managed Care, Other (non HMO) | Admitting: Occupational Therapy

## 2018-03-17 DIAGNOSIS — I69315 Cognitive social or emotional deficit following cerebral infarction: Secondary | ICD-10-CM

## 2018-03-17 DIAGNOSIS — R2681 Unsteadiness on feet: Secondary | ICD-10-CM

## 2018-03-17 DIAGNOSIS — R208 Other disturbances of skin sensation: Secondary | ICD-10-CM

## 2018-03-17 DIAGNOSIS — R270 Ataxia, unspecified: Secondary | ICD-10-CM

## 2018-03-17 DIAGNOSIS — I69354 Hemiplegia and hemiparesis following cerebral infarction affecting left non-dominant side: Secondary | ICD-10-CM

## 2018-03-17 DIAGNOSIS — M6281 Muscle weakness (generalized): Secondary | ICD-10-CM

## 2018-03-17 DIAGNOSIS — R278 Other lack of coordination: Secondary | ICD-10-CM

## 2018-03-17 NOTE — Therapy (Signed)
[facility] [address] [place], [place], [address] Phone: [phone]   Fax:  [phone]  Occupational Therapy Treatment  Patient Details  Name: Wayne Richards MRN: [id number] Date of Birth: 07-Apr-1956 Referring Provider: Dr. Lupe Carney   Encounter Date: 03/17/2018  OT End of Session - 03/17/18 1158    Visit Number  16    Number of Visits  25    Date for OT Re-Evaluation  04/09/18    Authorization Type  [employer]- 90 VL for all three disciplines combined    OT Start Time  1018    OT Stop Time  1100    OT Time Calculation (min)  42 min    Activity Tolerance  Patient tolerated treatment well       Past Medical History:  Diagnosis Date  . Asthma   . Stroke [facility])     Past Surgical History:  Procedure Laterality Date  . ACHILLES TENDON SURGERY      There were no vitals filed for this visit.  Subjective Assessment - 03/17/18 1024    Subjective   My knee was really sore yesterday but it feels better today.      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  Yes    Pain Score  4     Pain Location  Knee    Pain Orientation  Right    Pain Descriptors / Indicators  Throbbing;Tender    Pain Type  Chronic pain    Pain Onset  In the past 7 days    Pain Frequency  Constant    Aggravating Factors   touch,pressure    Pain Relieving Factors  taping                   OT Treatments/Exercises (OP) - 03/17/18 0001      Neurological Re-education Exercises   Other Exercises 1  Neuor re ed in sitting, standing to address active trunk flexion/extension on L side, addressing stability of L side with active movement of R side while sitting on moveable surface as well as while using UE support on moveable surface.  Incorporation of active trunk sitting on moveable surface using weight ball in LUE with  reaching activity.                 OT Short Term Goals - 03/17/18 1156      OT SHORT TERM GOAL #1   Title  Patient will complete a HEP designed to improve coord in left hand independently due 03/10/18    Status  Achieved      OT SHORT TERM GOAL #2   Title  Patient will complete a HEP designed to improve strength in left hand independently     Status  Achieved      OT SHORT TERM GOAL #3   Title  Patient will dress lower body with no more than intermittent min assist    Status  Achieved      OT SHORT TERM GOAL #4   Title  Patient will bathe himself in the shower with no more than intermittent min assist    Status  Achieved      OT SHORT TERM GOAL #5   Title  Patient will prepare simple hot meal (using stove top or microwave) for he and his wife with modified independence    Status  Achieved  OT SHORT TERM GOAL #6   Title  Patient will demonstrate improved coordiantion as evidenced by reduced time on 9 hole peg test by 30 seconds - to aide with buttoning, picking up and manipulating small objects, eg, medications    Status  Achieved        OT Long Term Goals - 03/17/18 1157      OT LONG TERM GOAL #1   Title  Patient will complete updated HEP designed to improve range of motion and strength in left shoulder due 9/14.      Status  On-going      OT LONG TERM GOAL #2   Title  Patient will demonstrate sufficient stand balance and functional use of left arm to reach into high / low cabinets to obtain pots, pans, dishes as needed for cooking tasks    Status  Achieved      OT LONG TERM GOAL #3   Title  Patient will be demonstrate sufficient left shoulder range of motion and sustained strength to don deodarant under each arm.      Status  Achieved      OT LONG TERM GOAL #4   Title  Patient will demonstrate sufficient left hand strength and coordination for aide with simple tool use (patient is right hand dominant)     Status  On-going      OT LONG TERM GOAL #5    Title  Patient will demonstrate 15 lb increase in grip strength in left hand to aide with opening jars, bottles, etc.      Status  Achieved            Plan - 03/17/18 1157    Clinical Impression Statement  Pt continues to progress in terms of LUE functional use, dynamic standing balance and trunk and LUE control.     Occupational Profile and client history currently impacting functional performance  Husband, father of 22- youngest is 8, retired Emergency planning/management officer, Teacher, English as a foreign language, Production designer, theatre/television/film of household    Occupational performance deficits (Please refer to evaluation for details):  ADL's;IADL's;Rest and Sleep;Work;Leisure    Rehab Potential  Excellent    Current Impairments/barriers affecting progress:  patient still under MD care to determine if stroke is stable - F/U CT scheduled this week.  F/U CT completed 7/18     OT Frequency  3x / week    OT Duration  8 weeks    OT Treatment/Interventions  Self-care/ADL training;Electrical Stimulation;Therapeutic exercise;Visual/perceptual remediation/compensation;Coping strategies training;Aquatic Therapy;Neuromuscular education;Splinting;Patient/family education;Balance training;Therapeutic activities;Functional Mobility Training;Energy conservation;DME and/or AE instruction;Manual Therapy;Cognitive remediation/compensation    Plan  . NMR LUE/TRUNK!!!  -PROXIMAL stable mobility for LUE/trunk , manual if needed to address alignment prior to activation, overhead reach, sustained activity with LUE, bilateral use of both UE's    Consulted and Agree with Plan of Care  Patient       Patient will benefit from skilled therapeutic intervention in order to improve the following deficits and impairments:  Decreased cognition, Decreased knowledge of use of DME, Impaired flexibility, Impaired vision/preception, Improper body mechanics, Impaired sensation, Decreased mobility, Decreased coordination, Cardiopulmonary status limiting activity, Decreased activity tolerance,  Decreased endurance, Decreased range of motion, Decreased strength, Impaired tone, Decreased coping skills, Decreased balance, Difficulty walking, Impaired perceived functional ability, Impaired UE functional use  Visit Diagnosis: Hemiplegia and hemiparesis following cerebral infarction affecting left non-dominant side (HCC)  Muscle weakness (generalized)  Unsteadiness on feet  Other disturbances of skin sensation  Other lack of coordination  Cognitive social or emotional  deficit following cerebral infarction  Ataxia    Problem List Patient Active Problem List   Diagnosis Date Noted  . Cerebral embolism with cerebral infarction 02/01/2018  . 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 Stroke-like symptoms   . Ataxia 01/31/2018  . Elevated blood pressure reading 01/31/2018  . Asthma     Norton Pastelulaski, Tasheem Elms Halliday, OTR/L 03/17/2018, 11:59 AM  [facility] [address] [place], [place], [address] Phone: [phone]   Fax:  [phone]  Name: Nilda SimmerFrank Maxton MRN: [id number] Date of Birth: 1955-11-21

## 2018-03-21 ENCOUNTER — Encounter: Payer: Self-pay | Admitting: Adult Health

## 2018-03-21 ENCOUNTER — Encounter: Payer: Managed Care, Other (non HMO) | Admitting: Occupational Therapy

## 2018-03-21 ENCOUNTER — Ambulatory Visit: Payer: Managed Care, Other (non HMO) | Admitting: Physical Therapy

## 2018-03-23 ENCOUNTER — Ambulatory Visit: Payer: Managed Care, Other (non HMO) | Admitting: Physical Therapy

## 2018-03-23 ENCOUNTER — Encounter: Payer: Managed Care, Other (non HMO) | Admitting: Occupational Therapy

## 2018-03-25 ENCOUNTER — Encounter: Payer: Managed Care, Other (non HMO) | Admitting: Occupational Therapy

## 2018-03-29 ENCOUNTER — Ambulatory Visit: Payer: Managed Care, Other (non HMO) | Attending: Internal Medicine | Admitting: Physical Therapy

## 2018-03-29 ENCOUNTER — Encounter: Payer: Self-pay | Admitting: Occupational Therapy

## 2018-03-29 ENCOUNTER — Ambulatory Visit: Payer: Managed Care, Other (non HMO) | Admitting: Occupational Therapy

## 2018-03-29 ENCOUNTER — Ambulatory Visit: Payer: Managed Care, Other (non HMO)

## 2018-03-29 DIAGNOSIS — R208 Other disturbances of skin sensation: Secondary | ICD-10-CM | POA: Insufficient documentation

## 2018-03-29 DIAGNOSIS — R2681 Unsteadiness on feet: Secondary | ICD-10-CM

## 2018-03-29 DIAGNOSIS — I69354 Hemiplegia and hemiparesis following cerebral infarction affecting left non-dominant side: Secondary | ICD-10-CM | POA: Insufficient documentation

## 2018-03-29 DIAGNOSIS — I69315 Cognitive social or emotional deficit following cerebral infarction: Secondary | ICD-10-CM | POA: Insufficient documentation

## 2018-03-29 DIAGNOSIS — R2689 Other abnormalities of gait and mobility: Secondary | ICD-10-CM | POA: Insufficient documentation

## 2018-03-29 DIAGNOSIS — R278 Other lack of coordination: Secondary | ICD-10-CM | POA: Insufficient documentation

## 2018-03-29 DIAGNOSIS — M6281 Muscle weakness (generalized): Secondary | ICD-10-CM

## 2018-03-29 NOTE — Therapy (Signed)
[facility] [address] [place], [place], [address] Phone: [phone]   Fax:  [phone]  Occupational Therapy Treatment  Patient Details  Name: Wayne Richards MRN: [id number] Date of Birth: 1956-07-22 Referring Provider: Dr. Lupe Carney   Encounter Date: 03/29/2018  OT End of Session - 03/29/18 1552    Visit Number  17    Number of Visits  25    Date for OT Re-Evaluation  04/09/18    Authorization Type  [employer]- 90 VL for all three disciplines combined    OT Start Time  1445    OT Stop Time  1528    OT Time Calculation (min)  43 min    Activity Tolerance  Patient tolerated treatment well    Behavior During Therapy  [facility] for tasks assessed/performed       Past Medical History:  Diagnosis Date  . Asthma   . Stroke [facility])     Past Surgical History:  Procedure Laterality Date  . ACHILLES TENDON SURGERY      There were no vitals filed for this visit.  Subjective Assessment - 03/29/18 1451    Subjective   I may need to have my knee drained (right knee)      Pertinent History  hypertension, vertigo, hyperlipidemia- now with diagnosed subacute Left pons infarct, and acute right pons infarct    Currently in Pain?  No/denies    Pain Score  0-No pain                   OT Treatments/Exercises (OP) - 03/29/18 0001      ADLs   LB Dressing  Patient is able to bathe and dress himself but needs to use compensatory strategies to do so due to decreased sensation and weakness in left hip.      Home Maintenance  Patient moved a dresser, and washer and dryer for his daughter this weekend.  He needed assistance to use hand truck to move items up stairs due to decreased strength.  Patient very emotional today - crying and laughing with little control.  Patient expressing frustration over having little emotional control.  Discussed pseudobulbar affect- and encouraged patient to speak with his MD reagrding options.   Patient is not eager to take additional medicine - but understood that provider may have many options to help normalize emotions post stroke.        Neurological Re-education Exercises   Other Exercises 1  Neuromuscular reeducation to address functional strengthening with hip flexion with abduction , external rotation as needed for bathing and dressing components.  Patient has difficulty coordination active trunk with posterior pelvis rotation to allow greater degrees of hip flexion in sitting.  Worked to increase core musculature in standing for sustained mtoor control to allow high controlled stepping, and sustained ability for single leg stance.               OT Education - 03/29/18 1552    Education Details  Encouraged patient to utilize mirror to help foster greater awareness of left hip / trunk coordiantion to cross leg in sitting.  Discussed imminenet discharge - patient is active in Yoga program, and routinely exercising outside of therapy in prep for discharge.      Person(s) Educated  Patient    Methods  Explanation;Demonstration    Comprehension  Verbalized understanding;Returned demonstration       OT Short Term Goals - 03/17/18 1156      OT  SHORT TERM GOAL #1   Title  Patient will complete a HEP designed to improve coord in left hand independently due 03/10/18    Status  Achieved      OT SHORT TERM GOAL #2   Title  Patient will complete a HEP designed to improve strength in left hand independently     Status  Achieved      OT SHORT TERM GOAL #3   Title  Patient will dress lower body with no more than intermittent min assist    Status  Achieved      OT SHORT TERM GOAL #4   Title  Patient will bathe himself in the shower with no more than intermittent min assist    Status  Achieved      OT SHORT TERM GOAL #5   Title  Patient will prepare simple hot meal (using stove top or microwave) for he and his wife with modified independence    Status  Achieved      OT SHORT  TERM GOAL #6   Title  Patient will demonstrate improved coordiantion as evidenced by reduced time on 9 hole peg test by 30 seconds - to aide with buttoning, picking up and manipulating small objects, eg, medications    Status  Achieved        OT Long Term Goals - 03/17/18 1157      OT LONG TERM GOAL #1   Title  Patient will complete updated HEP designed to improve range of motion and strength in left shoulder due 9/14.      Status  On-going      OT LONG TERM GOAL #2   Title  Patient will demonstrate sufficient stand balance and functional use of left arm to reach into high / low cabinets to obtain pots, pans, dishes as needed for cooking tasks    Status  Achieved      OT LONG TERM GOAL #3   Title  Patient will be demonstrate sufficient left shoulder range of motion and sustained strength to don deodarant under each arm.      Status  Achieved      OT LONG TERM GOAL #4   Title  Patient will demonstrate sufficient left hand strength and coordination for aide with simple tool use (patient is right hand dominant)     Status  On-going      OT LONG TERM GOAL #5   Title  Patient will demonstrate 15 lb increase in grip strength in left hand to aide with opening jars, bottles, etc.      Status  Achieved            Plan - 03/29/18 1553    Clinical Impression Statement  Pt continues to progress in terms of LUE functional use, dynamic standing balance and trunk and LUE control.     Occupational Profile and client history currently impacting functional performance  Husband, father of 14- youngest is 77, retired Emergency planning/management officer, Teacher, English as a foreign language, Production designer, theatre/television/film of household    Occupational performance deficits (Please refer to evaluation for details):  ADL's;IADL's;Rest and Sleep;Work;Leisure    Rehab Potential  Excellent    OT Frequency  3x / week    OT Duration  8 weeks    OT Treatment/Interventions  Self-care/ADL training;Electrical Stimulation;Therapeutic exercise;Visual/perceptual  remediation/compensation;Coping strategies training;Aquatic Therapy;Neuromuscular education;Splinting;Patient/family education;Balance training;Therapeutic activities;Functional Mobility Training;Energy conservation;DME and/or AE instruction;Manual Therapy;Cognitive remediation/compensation    Plan  . NMR LUE/TRUNK!!!  -PROXIMAL stable mobility for LUE/trunk , manual if needed to  address alignment prior to activation, overhead reach, sustained activity with LUE, bilateral use of both UE's    Clinical Decision Making  Several treatment options, min-mod task modification necessary    Consulted and Agree with Plan of Care  Patient       Patient will benefit from skilled therapeutic intervention in order to improve the following deficits and impairments:  Decreased cognition, Decreased knowledge of use of DME, Impaired flexibility, Impaired vision/preception, Improper body mechanics, Impaired sensation, Decreased mobility, Decreased coordination, Cardiopulmonary status limiting activity, Decreased activity tolerance, Decreased endurance, Decreased range of motion, Decreased strength, Impaired tone, Decreased coping skills, Decreased balance, Difficulty walking, Impaired perceived functional ability, Impaired UE functional use  Visit Diagnosis: Hemiplegia and hemiparesis following cerebral infarction affecting left non-dominant side (HCC)  Muscle weakness (generalized)  Unsteadiness on feet  Other disturbances of skin sensation  Other lack of coordination  Cognitive social or emotional deficit following cerebral infarction    Problem List Patient Active Problem List   Diagnosis Date Noted  . Cerebral embolism with cerebral infarction 02/01/2018  . 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 Stroke-like symptoms   . Ataxia 01/31/2018  . Elevated blood pressure reading 01/31/2018  . Asthma     Collier Salina, OTR/L 03/29/2018, 3:54 PM  [facility] [address] [place], [place], [address] Phone: [phone]   Fax:  [phone]  Name: Wayne Richards MRN: [id number] Date of Birth: 1955-10-13

## 2018-03-30 ENCOUNTER — Encounter: Payer: Self-pay | Admitting: Physical Therapy

## 2018-03-30 NOTE — Therapy (Signed)
[facility] [address] [place], [place], [address] Phone: [phone]   Fax:  [phone]  Physical Therapy Treatment  Patient Details  Name: Wayne Richards MRN: [id number] Date of Birth: 1956/05/21 Referring Provider: Dr. Donnie Coffin   Encounter Date: 03/29/2018  PT End of Session - 03/30/18 1020    Visit Number  10    Number of Visits  17    Date for PT Re-Evaluation  04/11/18    Authorization Type  Aetna -  VL for PT/OT/ST combined: 90  If seen for all three visits on the same day it constitutes 3 visits    Authorization - Visit Number  23    Authorization - Number of Visits  39    PT Start Time  6962    PT Stop Time  1447    PT Time Calculation (min)  42 min       Past Medical History:  Diagnosis Date  . Asthma   . Stroke [facility])     Past Surgical History:  Procedure Laterality Date  . ACHILLES TENDON SURGERY      There were no vitals filed for this visit.  Subjective Assessment - 03/30/18 1014    Subjective  Pt states he went on his trip to [place]; states left leg is still weak, and he has trouble lifting left leg onto Rt knee for donning shoes & socks    Pertinent History  ataxia; asthma: HTN: acute Rt pontine infarct:  subacute Lt pontine infarct     Diagnostic tests  CT scan scheduled for tomorrow    Patient Stated Goals  "Regain my normal function"    Currently in Pain?  No/denies                       [facility] Adult PT Treatment/Exercise - 03/30/18 0001      Neuro Re-ed    Neuro Re-ed Details   Tap ups to 2nd and 3rd steps with LLE for incr. hip flexion; no UE support used; 10 reps to each step       Exercises   Exercises  Knee/Hip      Knee/Hip Exercises: Aerobic   Elliptical  level 1.5 2" forward      Knee/Hip Exercises: Supine   Straight Leg Raises  Strengthening;Left;2 sets;10 reps   2# weight   Knee Flexion  Strengthening;Left;1 set;10 reps   3# weight used   Other Supine Knee/Hip Exercises  Lt hip extension control 2 sets 10 reps 2# weight used :        Lt hip flexion in hooklying position - 3# weight used - 10 reps No weight used on LLE - heel slide with Lt hip and knee extension with cues for control to increase eccentric strength   Prone -Lt hip extension with knee flexed  - no weight used - assistance given to complete full ROM - 10 reps  Above knee flexion LLE performed in prone position with 3# weight   Heel raises in standing - bil. LE's 10 reps;  LLE unilateral attempted with bil UE support 10 reps   Quadruped position - Lt hip extension attempted but pt reported Rt knee discomfort -placed pillow under Rt knee  to decrease pressure from mat  Pt performed Lt knee flexion/extension 5 reps with Lt hip extended in quadruped position     PT Short Term Goals - 03/09/18 1251      PT SHORT TERM  GOAL #1   Title  Improve Berg balance test score by >/= 5 points to reduce fall risk.  Will assess DGI to determine falls risk during gait    Baseline  49/56 - assess DGI at 4 weeks for long term goals; 03/09/18 FGA 29/30     Time  4    Period  Weeks    Status  Achieved      PT SHORT TERM GOAL #2   Title  Improve TUG score from 28.44 secs to </= 23 secs with RW to demo improved mobility.    Baseline  28.44 secs wtih RW; 8.53 sec no device    Time  4    Period  Weeks    Status  Achieved      PT SHORT TERM GOAL #3   Title  Amb. 100' with SPC with CGA on flat, even surface.    Baseline  no device    Time  4    Period  Weeks    Status  Achieved      PT SHORT TERM GOAL #4   Title  Increase gait velocity from 1.11 ft/sec to >/= 1.5 ft/sec with RW for incr. gait efficiency.    Baseline  29.43 secs = 1.11 ft/sec; 03/09/18 3.52 ft/sec    Time  4    Period  Weeks    Status  Achieved      PT SHORT TERM GOAL #5   Title  Independent in HEP for LLE strengthening.    Time  4    Period  Weeks    Status  Achieved        PT Long Term Goals -  03/09/18 1254      PT LONG TERM GOAL #1   Title  Pt will improve DGI by 4 points to decrease falls risk during gait    Baseline  (to be assessed at 4 weeks if BERG goal is met); FGA 29/30 therefore goal deferred    Time  8    Period  Weeks    Status  Deferred      PT LONG TERM GOAL #2   Title  Improve TUG score from 28.44 secs to </= 18 secs with RW for incr. functional mobility.    Baseline  28.44 secs with RW; 03/09/18 8.53 seconds no device    Time  8    Period  Weeks    Status  Achieved      PT LONG TERM GOAL #3   Title  Pt will be modified independent without device for household ambulation.    Time  8    Period  Weeks    Status  Achieved      PT LONG TERM GOAL #4   Title  Amb. 500' on even/uneven surfaces with SPC for increased community accessibility.      Time  8    Period  Weeks    Status  On-going      PT LONG TERM GOAL #5   Title  Negotiate 4 steps with 1 rail using a step over step sequence.    Baseline  03/09/18 4 steps no rail, step over step sequence    Time  8    Period  Weeks    Status  Achieved            Plan - 03/30/18 1022    Clinical Impression Statement  Pt continues to have Lt hip flexor, extensor and abductor weakness with Lt plantarflexor  weakness; pt also demonstrates decr. muscle endurance of Lt hamstrings. Pt fatigued at end of session with some dyspnea exhibited after performing elliptiacla exercise for 2"     Rehab Potential  Good    PT Frequency  2x / week    PT Duration  8 weeks    PT Treatment/Interventions  ADLs/Self Care Home Management;Aquatic Therapy;Functional mobility training;Stair training;Gait training;DME Instruction;Therapeutic activities;Therapeutic exercise;Balance training;Neuromuscular re-education;Patient/family education;Passive range of motion;Electrical Stimulation    PT Next Visit Plan  cont LLE strengthening; PF strengthening ex. in closed chain position    PT Home Exercise Plan  LLE strengthening - see pt  instructions - add as appropriate    Consulted and Agree with Plan of Care  Patient       Patient will benefit from skilled therapeutic intervention in order to improve the following deficits and impairments:  Abnormal gait, Decreased activity tolerance, Decreased balance, Decreased coordination, Decreased range of motion, Decreased knowledge of use of DME, Decreased endurance, Decreased strength, Impaired vision/preception, Impaired tone, Impaired sensation, Impaired UE functional use  Visit Diagnosis: Muscle weakness (generalized)  Unsteadiness on feet     Problem List Patient Active Problem List   Diagnosis Date Noted  . Cerebral embolism with cerebral infarction 02/01/2018  . Stroke (cerebrum) ([place]) 02/01/2018  . Stroke-like symptoms   . Ataxia 01/31/2018  . Elevated blood pressure reading 01/31/2018  . Asthma     Avon Mergenthaler, [place], PT 03/30/2018, 10:27 AM  [facility] [address] [place], [place], [address] Phone: [phone]   Fax:  [phone]  Name: Wayne Richards MRN: [id number] Date of Birth: [birth year]

## 2018-03-31 ENCOUNTER — Ambulatory Visit: Payer: Managed Care, Other (non HMO) | Admitting: Physical Therapy

## 2018-03-31 ENCOUNTER — Encounter: Payer: Managed Care, Other (non HMO) | Admitting: Occupational Therapy

## 2018-04-01 ENCOUNTER — Encounter: Payer: Managed Care, Other (non HMO) | Admitting: Occupational Therapy

## 2018-04-04 ENCOUNTER — Ambulatory Visit: Payer: Managed Care, Other (non HMO) | Admitting: Occupational Therapy

## 2018-04-04 ENCOUNTER — Encounter: Payer: Self-pay | Admitting: Occupational Therapy

## 2018-04-04 ENCOUNTER — Ambulatory Visit: Payer: Managed Care, Other (non HMO) | Admitting: Physical Therapy

## 2018-04-04 ENCOUNTER — Encounter: Payer: Self-pay | Admitting: Physical Therapy

## 2018-04-04 DIAGNOSIS — I69354 Hemiplegia and hemiparesis following cerebral infarction affecting left non-dominant side: Secondary | ICD-10-CM

## 2018-04-04 DIAGNOSIS — R208 Other disturbances of skin sensation: Secondary | ICD-10-CM

## 2018-04-04 DIAGNOSIS — M6281 Muscle weakness (generalized): Secondary | ICD-10-CM | POA: Diagnosis not present

## 2018-04-04 DIAGNOSIS — R2689 Other abnormalities of gait and mobility: Secondary | ICD-10-CM

## 2018-04-04 DIAGNOSIS — R2681 Unsteadiness on feet: Secondary | ICD-10-CM

## 2018-04-04 DIAGNOSIS — R278 Other lack of coordination: Secondary | ICD-10-CM

## 2018-04-04 NOTE — Therapy (Signed)
[facility] [address] [place], [place], [address] Phone: [phone]   Fax:  [phone]  Occupational Therapy Treatment  Patient Details  Name: Wayne Richards MRN: [id number] Date of Birth: [birth year] Referring Provider: Dr. Donnie Coffin   Encounter Date: 04/04/2018  OT End of Session - 04/04/18 1332    Visit Number  18    Number of Visits  25    Date for OT Re-Evaluation  04/09/18    Authorization Type  Aetna- 90 VL for all three disciplines combined    OT Start Time  1233    OT Stop Time  1312    OT Time Calculation (min)  39 min    Activity Tolerance  Patient tolerated treatment well    Behavior During Therapy  [facility] for tasks assessed/performed       Past Medical History:  Diagnosis Date  . Asthma   . Stroke [facility])     Past Surgical History:  Procedure Laterality Date  . ACHILLES TENDON SURGERY      There were no vitals filed for this visit.  Subjective Assessment - 04/04/18 1235    Subjective   I think I am ready to be done    Pertinent History  hypertension, vertigo, hyperlipidemia- now with diagnosed subacute Left pons infarct, and acute right pons infarct    Currently in Pain?  No/denies    Pain Score  0-No pain                   OT Treatments/Exercises (OP) - 04/04/18 0001      ADLs   Functional Mobility  Patient able to transition from supine on floor to satnd and back x 5 without rest, without loss of balance.      Home Maintenance  Patient has not returned to lawn care.  His wife has been managing the lawn.  Discussed with aptient that he is ready to return to mowing, trimming, blowing - outside maintenance.  Patient's wife wil lbe away for ~v month to care for her sick father, discussed with patient that he appears capable of all lawn maintenance and can resume.      Leisure  Patient was an avid golfer prior to this stroke, and has not returned to golfing.  We have addressed his  golf swing, and have given him strategies to reacclimate to his swing.  Patient now discharging from OT,a nd regularly working out and taking yoga classes.  Patient encouraged to start back to golfing in some capacity.  Discussed starting at the driving range or working on short game to begin to get involved in a healthy leisure nterest he enjoyed.      ADL Comments  Had a long discussion with patient about returning to life - not just recovering from his stroke, but now returning to previous level of activity, and leisure interests.  Patient is anxious about seeing friends - knowing their shared history involved eating unhealthy foods, and drinking together.  Discussed spending time with friends and enjoying food and drink in moderation.  Patient agreeable.               OT Education - 04/04/18 1331    Education Details  Encouraged patient to return to lawn maintenance and golf.      Person(s) Educated  Patient    Methods  Explanation    Comprehension  Verbalized understanding       OT Short Term Goals - 03/17/18 1156  OT SHORT TERM GOAL #1   Title  Patient will complete a HEP designed to improve coord in left hand independently due 03/10/18    Status  Achieved      OT SHORT TERM GOAL #2   Title  Patient will complete a HEP designed to improve strength in left hand independently     Status  Achieved      OT SHORT TERM GOAL #3   Title  Patient will dress lower body with no more than intermittent min assist    Status  Achieved      OT SHORT TERM GOAL #4   Title  Patient will bathe himself in the shower with no more than intermittent min assist    Status  Achieved      OT SHORT TERM GOAL #5   Title  Patient will prepare simple hot meal (using stove top or microwave) for he and his wife with modified independence    Status  Achieved      OT SHORT TERM GOAL #6   Title  Patient will demonstrate improved coordiantion as evidenced by reduced time on 9 hole peg test by 30 seconds  - to aide with buttoning, picking up and manipulating small objects, eg, medications    Status  Achieved        OT Long Term Goals - 04/04/18 1333      OT LONG TERM GOAL #1   Title  Patient will complete updated HEP designed to improve range of motion and strength in left shoulder due 9/14.      Status  Achieved      OT LONG TERM GOAL #2   Title  Patient will demonstrate sufficient stand balance and functional use of left arm to reach into high / low cabinets to obtain pots, pans, dishes as needed for cooking tasks    Status  Achieved      OT LONG TERM GOAL #3   Title  Patient will be demonstrate sufficient left shoulder range of motion and sustained strength to don deodarant under each arm.      Status  Achieved      OT LONG TERM GOAL #4   Title  Patient will demonstrate sufficient left hand strength and coordination for aide with simple tool use (patient is right hand dominant)     Status  Achieved      OT LONG TERM GOAL #5   Title  Patient will demonstrate 15 lb increase in grip strength in left hand to aide with opening jars, bottles, etc.      Status  Achieved            Plan - 04/04/18 1332    Clinical Impression Statement  Patient has met all goals and is exercising in the community daily.   Patient has regained independence in ADL/IADL      Occupational Profile and client history currently impacting functional performance  Husband, father of 92- youngest is 16, retired Engineer, structural, Air cabin crew, Freight forwarder of household    Occupational performance deficits (Please refer to evaluation for details):  ADL's;IADL's;Rest and Sleep;Work;Leisure    Rehab Potential  Excellent    OT Frequency  3x / week    OT Duration  8 weeks    OT Treatment/Interventions  Self-care/ADL training;Electrical Stimulation;Therapeutic exercise;Visual/perceptual remediation/compensation;Coping strategies training;Aquatic Therapy;Neuromuscular education;Splinting;Patient/family education;Balance  training;Therapeutic activities;Functional Mobility Training;Energy conservation;DME and/or AE instruction;Manual Therapy;Cognitive remediation/compensation    Plan  DISCHARGE OT    Consulted and Agree with Plan  of Care  Patient       Patient will benefit from skilled therapeutic intervention in order to improve the following deficits and impairments:  Decreased cognition, Decreased knowledge of use of DME, Impaired flexibility, Impaired vision/preception, Improper body mechanics, Impaired sensation, Decreased mobility, Decreased coordination, Cardiopulmonary status limiting activity, Decreased activity tolerance, Decreased endurance, Decreased range of motion, Decreased strength, Impaired tone, Decreased coping skills, Decreased balance, Difficulty walking, Impaired perceived functional ability, Impaired UE functional use  Visit Diagnosis: Hemiplegia and hemiparesis following cerebral infarction affecting left non-dominant side (HCC)  Muscle weakness (generalized)  Unsteadiness on feet  Other disturbances of skin sensation  Other lack of coordination    Problem List Patient Active Problem List   Diagnosis Date Noted  . Cerebral embolism with cerebral infarction 02/01/2018  . Stroke (cerebrum) ([place]) 02/01/2018  . Stroke-like symptoms   . Ataxia 01/31/2018  . Elevated blood pressure reading 01/31/2018  . Asthma   OCCUPATIONAL THERAPY DISCHARGE SUMMARY  Visits from Start of Care: 18  Current functional level related to goals / functional outcomes: Modified independent with ADL/IADL Patient has made excellent functional gains and is ready for OT discharge earlier than expected.     Remaining deficits: Left sided weakness   Education / Equipment: HEP UE  Plan: Patient agrees to discharge.  Patient goals were met. Patient is being discharged due to meeting the stated rehab goals.  ?????         Mariah Milling, OTR/L 04/04/2018, 1:34 PM  [facility] [address] [place], [place], [address] Phone: [phone]   Fax:  [phone]  Name: Wayne Richards MRN: [id number] Date of Birth: [birth year]

## 2018-04-04 NOTE — Therapy (Signed)
[facility] [address] [place], [place], [address] Phone: [phone]   Fax:  [phone]  Physical Therapy Treatment  Patient Details  Name: Wayne Richards MRN: [id number] Date of Birth: 26-May-1956 Referring Provider: Dr. Lupe Carney   Encounter Date: 04/04/2018  PT End of Session - 04/04/18 2016    Visit Number  11    Number of Visits  17    Date for PT Re-Evaluation  04/11/18    Authorization Type  Aetna -  VL for PT/OT/ST combined: 90  If seen for all three visits on the same day it constitutes 3 visits    Authorization - Visit Number  24    Authorization - Number of Visits  90    PT Start Time  1315    PT Stop Time  1400    PT Time Calculation (min)  45 min       Past Medical History:  Diagnosis Date  . Asthma   . Stroke [facility])     Past Surgical History:  Procedure Laterality Date  . ACHILLES TENDON SURGERY      There were no vitals filed for this visit.  Subjective Assessment - 04/04/18 2007    Subjective  Pt states he joined gym - went this morning; states left leg is still weak    Pertinent History  ataxia; asthma: HTN: acute Rt pontine infarct:  subacute Lt pontine infarct     Patient Stated Goals  "Regain my normal function"    Currently in Pain?  No/denies                       [facility] Adult PT Treatment/Exercise - 04/04/18 1329      Exercises   Exercises  Knee/Hip      Knee/Hip Exercises: Standing   Heel Raises  Both;1 set;10 reps   LLE only 1 set in closed chain position   Other Standing Knee Exercises  Pt performed forward, backward and sidestepping 30' x 2 reps each on tiptoes for plantarflexor strengthening       Knee/Hip Exercises: Seated   Other Seated Knee/Hip Exercises  Lt hip flexion/extension in hooklying position with 3# weight     Other Seated Knee/Hip Exercises  Lt hip flexion with external rotation - no weight - 10 reps      Knee/Hip Exercises: Supine   Straight Leg Raises  Strengthening;Left;10 reps   3#   Other Supine Knee/Hip Exercises  Lt hip extension control 10 reps      Knee/Hip Exercises: Sidelying   Hip ABduction  AROM;Left;1 set;10 reps    Other Sidelying Knee/Hip Exercises  Lt hip abduction with clockwise and then counterclockwise circles 10 reps each       Lt hip extension with knee flexed at 90 degrees - 10 reps each with min to mod assist for full ROM (pillow placed under hips to Reduce strain on low back)       PT Education - 04/04/18 2049    Education Details  see updated HEP for Lt hip strengthening    Person(s) Educated  Patient    Methods  Explanation;Demonstration;Handout   handout to be given next session   Comprehension  Verbalized understanding;Returned demonstration       PT Short Term Goals - 03/09/18 1251      PT SHORT TERM GOAL #1   Title  Improve Berg balance test score by >/= 5 points to reduce fall risk.  Will assess DGI to determine falls risk during gait    Baseline  49/56 - assess DGI at 4 weeks for long term goals; 03/09/18 FGA 29/30     Time  4    Period  Weeks    Status  Achieved      PT SHORT TERM GOAL #2   Title  Improve TUG score from 28.44 secs to </= 23 secs with RW to demo improved mobility.    Baseline  28.44 secs wtih RW; 8.53 sec no device    Time  4    Period  Weeks    Status  Achieved      PT SHORT TERM GOAL #3   Title  Amb. 100' with SPC with CGA on flat, even surface.    Baseline  no device    Time  4    Period  Weeks    Status  Achieved      PT SHORT TERM GOAL #4   Title  Increase gait velocity from 1.11 ft/sec to >/= 1.5 ft/sec with RW for incr. gait efficiency.    Baseline  29.43 secs = 1.11 ft/sec; 03/09/18 3.52 ft/sec    Time  4    Period  Weeks    Status  Achieved      PT SHORT TERM GOAL #5   Title  Independent in HEP for LLE strengthening.    Time  4    Period  Weeks    Status  Achieved        PT Long Term Goals - 04/04/18 1315      PT LONG TERM  GOAL #1   Title  Pt will improve DGI by 4 points to decrease falls risk during gait    Status  Deferred      PT LONG TERM GOAL #2   Title  Improve TUG score from 28.44 secs to </= 18 secs with RW for incr. functional mobility.    Baseline  28.44 secs with RW; 03/09/18 8.53 seconds no device    Status  Achieved      PT LONG TERM GOAL #3   Title  Pt will be modified independent without device for household ambulation.    Status  Achieved      PT LONG TERM GOAL #4   Title  Amb. 500' on even/uneven surfaces with [facility] for increased community accessibility.      Status  On-going      PT LONG TERM GOAL #5   Title  Negotiate 4 steps with 1 rail using a step over step sequence.    Status  Achieved            Plan - 04/04/18 2017    Clinical Impression Statement  Pt continues to demonstrate Lt hip flexor, extensor, abductor and Lt gastroc weakness with decreased muscle endurance. Noted increased gait deviations at end of session due to LLE fatigue.  Pt unable to flex and externally rotate LLE as much as he is able to do so with his RLE.      Rehab Potential  Good    PT Frequency  2x / week    PT Duration  8 weeks    PT Treatment/Interventions  ADLs/Self Care Home Management;Aquatic Therapy;Functional mobility training;Stair training;Gait training;DME Instruction;Therapeutic activities;Therapeutic exercise;Balance training;Neuromuscular re-education;Patient/family education;Passive range of motion;Electrical Stimulation    PT Next Visit Plan  give pics for LLE strengthening    PT Home Exercise Plan  LLE strengthening - see pt instructions -  add as appropriate    Consulted and Agree with Plan of Care  Patient       Patient will benefit from skilled therapeutic intervention in order to improve the following deficits and impairments:  Abnormal gait, Decreased activity tolerance, Decreased balance, Decreased coordination, Decreased range of motion, Decreased knowledge of use of DME, Decreased  endurance, Decreased strength, Impaired vision/preception, Impaired tone, Impaired sensation, Impaired UE functional use  Visit Diagnosis: Muscle weakness (generalized)  Other abnormalities of gait and mobility     Problem List Patient Active Problem List   Diagnosis Date Noted  . Cerebral embolism with cerebral infarction 02/01/2018  . 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 Stroke-like symptoms   . Ataxia 01/31/2018  . Elevated blood pressure reading 01/31/2018  . Asthma     Rito Lecomte, Donavan Burnet, PT 04/04/2018, 8:50 PM  [facility] [address] [place], [place], [address] Phone: [phone]   Fax:  [phone]  Name: Wayne Richards MRN: [id number] Date of Birth: June 03, 1956

## 2018-04-04 NOTE — Patient Instructions (Signed)
Hip External Rotation: Resisted    Sit with band loop around left ankle, anchor on same side. Keeping thigh flat and knee bent at right angle, pull ankle across body. Repeat _10___ times per set. Do _1-2___ sets per session. Do _1_ sessions per day.  http://orth.exer.us/696    Hip External Rotation Circles: Transverse Plane Stability    Side-lying, legs slightly forward. Lift top leg above hip height. Make _10__ circles clockwise, then repeat counterclockwise keeping pelvis still. Repeat with other leg. Challenge: Hand on hip not floor. Do _1__ times per day.  CLOCKWISE and COUNTERCLOCKWISE CIRCLES  http://ss.exer.us/84   Heel Raise: Unilateral (Standing)    Balance on left foot, then rise on ball of foot. Repeat __10__ times per set. Do _2___ sets per session. Do _1___ sessions per day.  OPTION:  CAN DO IN SEATED POSITION; PLACE RIGHT FOOT ON BALANCE BUBBLE OR SOMETHING UNEVEN  SO THAT LEFT LEG MUST DO MOST OF WEIGHT-BEARING FOR HEEL RAISE EXERCISE  ( EXERCISE WE DID OFF SIDE OF MAT TABLE - COMING UP ON TIPTOES) http://orth.exer.us/40    Hip Extension (Prone)    Lift left leg ____ inches from floor, keeping knee locked. Repeat ____ times per set. Do ____ sets per session. Do ____ sessions per day.  http://orth.exer.us/98   Copyright  VHI. All rights reserved.  Bent Knee Lift (Prone)    Abdomen and head supported, bend left knee and slowly raise hip. Avoid arching low back. Repeat __10__ times per set. Do _1___ sets per session. Do __1__ sessions per day.  http://orth.exer.us/1110   HIP: Abduction - Side-Lying    Lie on side, legs straight and in line with trunk. Squeeze glutes. Raise top leg up and slightly back. Point toes forward. _10__ reps per set, _1_ sets per day, __5_ days per week Bend bottom leg to stabilize pelvis.  Copyright  VHI. All rights reserved.  HIP: Abduction / External Rotation (Band)    Place band around knees. Lie on side with  hips and knees bent. Raise top knee up, squeezing glutes. Keep feet together. Hold _5__ seconds. Use __RED ______ band. _10__ reps per set, _3__ sets per day, _5__ days per week     Straight Leg Raise    Tighten stomach and slowly raise locked right leg __12__ inches from floor. Repeat _10___ times per set. Do __1-2__ sets per session. Do _1___ sessions per day.   3# WEIGHT USED IN PT http://orth.exer.us/1102

## 2018-04-05 ENCOUNTER — Ambulatory Visit: Payer: Managed Care, Other (non HMO) | Admitting: Occupational Therapy

## 2018-04-05 ENCOUNTER — Ambulatory Visit: Payer: Managed Care, Other (non HMO) | Admitting: Physical Therapy

## 2018-04-05 DIAGNOSIS — M6281 Muscle weakness (generalized): Secondary | ICD-10-CM | POA: Diagnosis not present

## 2018-04-05 DIAGNOSIS — R2681 Unsteadiness on feet: Secondary | ICD-10-CM

## 2018-04-05 DIAGNOSIS — R2689 Other abnormalities of gait and mobility: Secondary | ICD-10-CM

## 2018-04-05 NOTE — Patient Instructions (Signed)
KNEE: Flexion - Prone    Bend knee. Raise heel toward buttocks. Do not raise hips. 10   reps per set, _1__ sets per day, _5  days per week  3# weight used   Place pillow under hips - should not feel this in lowerback

## 2018-04-06 ENCOUNTER — Encounter: Payer: Self-pay | Admitting: Physical Therapy

## 2018-04-06 NOTE — Therapy (Signed)
[facility] [address] [place], [place], [address] Phone: [phone]   Fax:  [phone]  Physical Therapy Treatment  Patient Details  Name: Wayne Richards MRN: [id number] Date of Birth: 08/27/1955 Referring Provider: Dr. Lupe Carney   Encounter Date: 04/05/2018  PT End of Session - 04/06/18 0941    Visit Number  12    Number of Visits  20    Date for PT Re-Evaluation  05/05/18    Authorization Type  [employer] -  VL for PT/OT/ST combined: 90  If seen for all three visits on the same day it constitutes 3 visits    Authorization - Visit Number  25    Authorization - Number of Visits  90    PT Start Time  1404    PT Stop Time  1448    PT Time Calculation (min)  44 min       Past Medical History:  Diagnosis Date  . Asthma   . Stroke [facility])     Past Surgical History:  Procedure Laterality Date  . ACHILLES TENDON SURGERY      There were no vitals filed for this visit.  Subjective Assessment - 04/06/18 0938    Subjective  Pt is accompanied to PT by his wife, Somona; states he went to Yoga class this am    Patient is accompained by:  Family member   wife - Somona   Pertinent History  ataxia; asthma: HTN: acute Rt pontine infarct:  subacute Lt pontine infarct     Patient Stated Goals  "Regain my normal function"    Currently in Pain?  No/denies        TherEx:  Instructed pt in updated HEP for LLE strengthening exercises; pictures given today; wife present during session for instruction  Pt performed Lt SLR with 3# weight 10 reps - with cues for controlled descent for eccentric control Lt hip abduction in Rt sidelying - 3# weight used - 10 reps - knee extended  Verbally reviewed clamshell exercise with use of green theraband as pt reports he is currently doing this exercise at home  Lt hip abduction with no weight - 10 circles clockwise and then 10 circles counterclockwise for trunk stabilization with Lt hip  abduction for strengthening and Coordination  Lt plantarflexion strengthening in closed chain off high/low mat table - Rt foot on balance bubble - 10 reps for Lt gastroc strengthening Lt unilateral heel raise at counter 5 reps with bil. UE support  Prone - Lt hip extension with knee extended - no weight 10 reps;  With Lt knee flexed at 90 degrees - mod assist needed for Lt hip extension - 10 reps performed  Prone - Lt hamstring strengthening with 3# weight   Forward ambulation on tiptoes approx. 22' and then backward on tiptoes 25' x 2 reps Sideways amb. 25' x 2 reps on tiptoes for Lt hamstring & gastroc strengthening   Pt jogged approx. 10' x 1 rep with supervision                     PT Education - 04/06/18 0940    Education Details  Pt given pictures for LLE strengthening exercises - pt performed these exercises in PT to ensure understanding and proper technique/form; wife present for instruction to assist pt as needed     Person(s) Educated  Patient;Spouse    Methods  Explanation;Demonstration;Handout    Comprehension  Verbalized understanding;Returned demonstration  PT Short Term Goals - 03/09/18 1251      PT SHORT TERM GOAL #1   Title  Improve Berg balance test score by >/= 5 points to reduce fall risk.  Will assess DGI to determine falls risk during gait    Baseline  49/56 - assess DGI at 4 weeks for long term goals; 03/09/18 FGA 29/30     Time  4    Period  Weeks    Status  Achieved      PT SHORT TERM GOAL #2   Title  Improve TUG score from 28.44 secs to </= 23 secs with RW to demo improved mobility.    Baseline  28.44 secs wtih RW; 8.53 sec no device    Time  4    Period  Weeks    Status  Achieved      PT SHORT TERM GOAL #3   Title  Amb. 100' with SPC with CGA on flat, even surface.    Baseline  no device    Time  4    Period  Weeks    Status  Achieved      PT SHORT TERM GOAL #4   Title  Increase gait velocity from 1.11 ft/sec to >/= 1.5  ft/sec with RW for incr. gait efficiency.    Baseline  29.43 secs = 1.11 ft/sec; 03/09/18 3.52 ft/sec    Time  4    Period  Weeks    Status  Achieved      PT SHORT TERM GOAL #5   Title  Independent in HEP for LLE strengthening.    Time  4    Period  Weeks    Status  Achieved        PT Long Term Goals - 04/06/18 0946      PT LONG TERM GOAL #1   Title  Pt will improve DGI by 4 points to decrease falls risk during gait    Status  Deferred      PT LONG TERM GOAL #2   Title  Improve TUG score from 28.44 secs to </= 18 secs with RW for incr. functional mobility.    Baseline  28.44 secs with RW; 03/09/18 8.53 seconds no device    Status  Achieved      PT LONG TERM GOAL #3   Title  Pt will be modified independent without device for household ambulation.    Status  Achieved      PT LONG TERM GOAL #4   Title  Amb. 500' on even/uneven surfaces with [facility] for increased community accessibility.      Baseline  No device needed - 04-05-18     Status  Achieved      PT LONG TERM GOAL #5   Title  Negotiate 4 steps with 1 rail using a step over step sequence.    Baseline  03/09/18 4 steps no rail, step over step sequence    Status  Achieved      PT LONG TERM GOAL #6   Title  Pt will demonstrate ability to flex and externally rotate LLE by placing Lt foot on Rt knee for increased ease with donning shoes and socks.      Time  4    Period  Weeks    Status  New    Target Date  05/05/18      PT LONG TERM GOAL #7   Title  Pt will demonstrate increased strength and coordination of LLE by jogging  115' around track in clinic gym without LOB.    Time  4    Period  Weeks    Status  New    Target Date  05/05/18      PT LONG TERM GOAL #8   Title  Pt will subjectively report at least 30% increased strength in LLE for increased mobility endurance with standing/ambulation.    Time  4    Period  Weeks    Status  New    Target Date  05/05/18      PT LONG TERM GOAL  #9   TITLE  Independent in  updated HEP for LLE strengthening.    Time  4    Period  Weeks    Status  New    Target Date  05/05/18            Plan - 04/06/18 0942    Clinical Impression Statement  Pt performed Lt hip abduction in sidelying with 3# weight for first time today; continues to have significant Lt plantarflexor weakness and also Lt hip extensor weakness as pt is unable to independently perform prone Lt hip extension with knee flexed at 90 degrees    Rehab Potential  Good    PT Frequency  2x / week    PT Duration  8 weeks    PT Treatment/Interventions  ADLs/Self Care Home Management;Aquatic Therapy;Functional mobility training;Stair training;Gait training;DME Instruction;Therapeutic activities;Therapeutic exercise;Balance training;Neuromuscular re-education;Patient/family education;Passive range of motion;Electrical Stimulation    PT Next Visit Plan  continue LLE strengthening  - side step with resistance band - step ups and lateral step ups ; try quadruped    PT Home Exercise Plan  LLE strengthening - see pt instructions - add as appropriate    Consulted and Agree with Plan of Care  Patient       Patient will benefit from skilled therapeutic intervention in order to improve the following deficits and impairments:  Abnormal gait, Decreased activity tolerance, Decreased balance, Decreased coordination, Decreased range of motion, Decreased knowledge of use of DME, Decreased endurance, Decreased strength, Impaired vision/preception, Impaired tone, Impaired sensation, Impaired UE functional use  Visit Diagnosis: Muscle weakness (generalized)  Unsteadiness on feet  Other abnormalities of gait and mobility     Problem List Patient Active Problem List   Diagnosis Date Noted  . Cerebral embolism with cerebral infarction 02/01/2018  . 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 Stroke-like symptoms   . Ataxia 01/31/2018  . Elevated blood pressure reading 01/31/2018  . Asthma     Nayeli Calvert, Donavan Burnet,  PT 04/06/2018, 9:53 AM  [facility] [address] [place], [place], [address] Phone: [phone]   Fax:  [phone]  Name: Mung Rinker MRN: [id number] Date of Birth: Dec 07, 1955

## 2018-04-07 ENCOUNTER — Encounter: Payer: Managed Care, Other (non HMO) | Admitting: Occupational Therapy

## 2018-04-11 ENCOUNTER — Ambulatory Visit: Payer: Managed Care, Other (non HMO) | Admitting: Physical Therapy

## 2018-04-11 ENCOUNTER — Encounter: Payer: Self-pay | Admitting: Physical Therapy

## 2018-04-11 DIAGNOSIS — M6281 Muscle weakness (generalized): Secondary | ICD-10-CM

## 2018-04-11 DIAGNOSIS — R278 Other lack of coordination: Secondary | ICD-10-CM

## 2018-04-11 DIAGNOSIS — R2681 Unsteadiness on feet: Secondary | ICD-10-CM

## 2018-04-11 DIAGNOSIS — R2689 Other abnormalities of gait and mobility: Secondary | ICD-10-CM

## 2018-04-11 NOTE — Therapy (Signed)
[facility] [address] [place], [place], [address] Phone: [phone]   Fax:  [phone]  Physical Therapy Treatment  Patient Details  Name: Wayne Richards MRN: [id number] Date of Birth: [birth year] Referring Provider: Dr. Lupe Carney   Encounter Date: 04/11/2018  PT End of Session - 04/11/18 0904    Visit Number  13    Number of Visits  20    Date for PT Re-Evaluation  05/05/18    Authorization Type  [employer] -  VL for PT/OT/ST combined: 90  If seen for all three visits on the same day it constitutes 3 visits    Authorization - Visit Number  26    Authorization - Number of Visits  90    PT Start Time  0806    PT Stop Time  0847    PT Time Calculation (min)  41 min       Past Medical History:  Diagnosis Date  . Asthma   . Stroke [facility])     Past Surgical History:  Procedure Laterality Date  . ACHILLES TENDON SURGERY      There were no vitals filed for this visit.  Subjective Assessment - 04/11/18 0859    Subjective  Pt states he is doing well - states he has some left cervical tightness which started on Friday; doesn't know if he slept on it wrong or moved wrong in Yoga    Patient is accompained by:  Family member    Pertinent History  ataxia; asthma: HTN: acute Rt pontine infarct:  subacute Lt pontine infarct     Patient Stated Goals  "Regain my normal function"    Currently in Pain?  Yes    Pain Score  5     Pain Location  Neck    Pain Orientation  Left    Pain Descriptors / Indicators  Tightness;Discomfort;Aching    Pain Type  Acute pain    Pain Onset  In the past 7 days    Pain Frequency  Constant    Aggravating Factors   no specific factors - states it just occurred     Pain Relieving Factors  AROM       TherAct:  Pt performed standing LLE hip flexion with external rotation by standing on RLE & touching side of cabinet as high as possible With LLE -10 reps; pt then performed with RLE (same  movement of hip flexion with external rotation) with standing on LLE in plantarflexed position for Closed chain strengthening Pt performed jumping 10 reps inside // bars (no UE support needed); performed LLE single limb hopping 10 reps with entire foot contact Pt then performed LLE single limb hopping in plantarflexed position with bil. UE support needed to clear floor - 10 reps 1/2 jumping jacks x 10 reps - no UE support Activities on mini- trampoline - jumping, LLE single limb hopping and lunges - all 10 reps each with UE support prn  Pt jogged 25' x 2 reps with SBA without LOB  TherEx; Pt performed resistive side stepping with squats with use of green theraband 20' x 2 reps with cues for pt to stay on tip toes as able for plantarflexor strengthening Pt performed forward amb. With green theraband 20' x 1, then resistive backward ambulation with green theraband 20' x 1 rep with cues to take large backward step for hip  strengthening   NeuroRe-ed:  Quadruped position - Lt hip extension with knee flexed at 90 degrees -  pulses to ceiling with Lt foot - 10 reps Lt knee flexion/extension with Lt hip extended in quadruped position - 10 reps Lt hip abduction (fire hydrant) in quadruped position - 10 reps Lt hip flexion/extension in quadruped position 10 reps                        PT Short Term Goals - 03/09/18 1251      PT SHORT TERM GOAL #1   Title  Improve Berg balance test score by >/= 5 points to reduce fall risk.  Will assess DGI to determine falls risk during gait    Baseline  49/56 - assess DGI at 4 weeks for long term goals; 03/09/18 FGA 29/30     Time  4    Period  Weeks    Status  Achieved      PT SHORT TERM GOAL #2   Title  Improve TUG score from 28.44 secs to </= 23 secs with RW to demo improved mobility.    Baseline  28.44 secs wtih RW; 8.53 sec no device    Time  4    Period  Weeks    Status  Achieved      PT SHORT TERM GOAL #3   Title  Amb. 100' with  SPC with CGA on flat, even surface.    Baseline  no device    Time  4    Period  Weeks    Status  Achieved      PT SHORT TERM GOAL #4   Title  Increase gait velocity from 1.11 ft/sec to >/= 1.5 ft/sec with RW for incr. gait efficiency.    Baseline  29.43 secs = 1.11 ft/sec; 03/09/18 3.52 ft/sec    Time  4    Period  Weeks    Status  Achieved      PT SHORT TERM GOAL #5   Title  Independent in HEP for LLE strengthening.    Time  4    Period  Weeks    Status  Achieved        PT Long Term Goals - 04/06/18 0946      PT LONG TERM GOAL #1   Title  Pt will improve DGI by 4 points to decrease falls risk during gait    Status  Deferred      PT LONG TERM GOAL #2   Title  Improve TUG score from 28.44 secs to </= 18 secs with RW for incr. functional mobility.    Baseline  28.44 secs with RW; 03/09/18 8.53 seconds no device    Status  Achieved      PT LONG TERM GOAL #3   Title  Pt will be modified independent without device for household ambulation.    Status  Achieved      PT LONG TERM GOAL #4   Title  Amb. 500' on even/uneven surfaces with [facility] for increased community accessibility.      Baseline  No device needed - 04-05-18     Status  Achieved      PT LONG TERM GOAL #5   Title  Negotiate 4 steps with 1 rail using a step over step sequence.    Baseline  03/09/18 4 steps no rail, step over step sequence    Status  Achieved      PT LONG TERM GOAL #6   Title  Pt will demonstrate ability to flex and externally rotate LLE by placing Lt  foot on Rt knee for increased ease with donning shoes and socks.      Time  4    Period  Weeks    Status  New    Target Date  05/05/18      PT LONG TERM GOAL #7   Title  Pt will demonstrate increased strength and coordination of LLE by jogging 115' around track in clinic gym without LOB.    Time  4    Period  Weeks    Status  New    Target Date  05/05/18      PT LONG TERM GOAL #8   Title  Pt will subjectively report at least 30% increased  strength in LLE for increased mobility endurance with standing/ambulation.    Time  4    Period  Weeks    Status  New    Target Date  05/05/18      PT LONG TERM GOAL  #9   TITLE  Independent in updated HEP for LLE strengthening.    Time  4    Period  Weeks    Status  New    Target Date  05/05/18            Plan - 04/11/18 0904    Clinical Impression Statement  Pt did very well with plyometric exercises with pt improving with left plantarflexion strength with continued difficulty with pt maintaining Lt plantarflexed position in closed chain; pt had difficulty with Lt single limb hopping due to plantarflexor weakness.  Pt able to jog 25' x 2 reps without LOB.  Coordination in LLE is improving.    Rehab Potential  Good    PT Frequency  2x / week    PT Duration  4 weeks    PT Treatment/Interventions  ADLs/Self Care Home Management;Aquatic Therapy;Functional mobility training;Stair training;Gait training;DME Instruction;Therapeutic activities;Therapeutic exercise;Balance training;Neuromuscular re-education;Patient/family education;Passive range of motion;Electrical Stimulation    PT Next Visit Plan  Give picture of Lt knee flexion for HEP:  continue LLE strengthening  - side step with resistance band - step ups and lateral step ups ; try quadruped    PT Home Exercise Plan  LLE strengthening - see pt instructions - add as appropriate       Patient will benefit from skilled therapeutic intervention in order to improve the following deficits and impairments:  Abnormal gait, Decreased activity tolerance, Decreased balance, Decreased coordination, Decreased range of motion, Decreased knowledge of use of DME, Decreased endurance, Decreased strength, Impaired vision/preception, Impaired tone, Impaired sensation, Impaired UE functional use  Visit Diagnosis: Muscle weakness (generalized)  Other abnormalities of gait and mobility  Unsteadiness on feet  Other lack of  coordination     Problem List Patient Active Problem List   Diagnosis Date Noted  . Cerebral embolism with cerebral infarction 02/01/2018  . 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 Stroke-like symptoms   . Ataxia 01/31/2018  . Elevated blood pressure reading 01/31/2018  . Asthma     Naithan Delage, Donavan BurnetLinda Suzanne, PT 04/11/2018, 9:09 AM  [facility] [address] [place], [place], [address] Phone: [phone]   Fax:  [phone]  Name: Wayne SimmerFrank Richards MRN: [id number] Date of Birth: Oct 09, 1955

## 2018-04-14 ENCOUNTER — Ambulatory Visit: Payer: Managed Care, Other (non HMO) | Admitting: Physical Therapy

## 2018-04-14 DIAGNOSIS — R2689 Other abnormalities of gait and mobility: Secondary | ICD-10-CM

## 2018-04-14 DIAGNOSIS — M6281 Muscle weakness (generalized): Secondary | ICD-10-CM

## 2018-04-15 NOTE — Therapy (Signed)
[facility] [address] [place], [place], [address] Phone: [phone]   Fax:  [phone]  Physical Therapy Treatment  Patient Details  Name: Wayne Richards MRN: [id number] Date of Birth: January 25, 1956 Referring Provider: Dr. Lupe Carney   Encounter Date: 04/14/2018  PT End of Session - 04/15/18 1132    Visit Number  14    Number of Visits  20    Date for PT Re-Evaluation  05/05/18    Authorization Type  [employer] -  VL for PT/OT/ST combined: 90  If seen for all three visits on the same day it constitutes 3 visits    Authorization - Visit Number  27    Authorization - Number of Visits  90    PT Start Time  1318    PT Stop Time  1402    PT Time Calculation (min)  44 min       Past Medical History:  Diagnosis Date  . Asthma   . Stroke [facility])     Past Surgical History:  Procedure Laterality Date  . ACHILLES TENDON SURGERY      There were no vitals filed for this visit.  Subjective Assessment - 04/15/18 1128    Subjective  Pt states he did not make it to Yoga this morning- still doing well overall; feels he is ready to finish up with PT next session    Pertinent History  ataxia; asthma: HTN: acute Rt pontine infarct:  subacute Lt pontine infarct     Patient Stated Goals  "Regain my normal function"    Currently in Pain?  No/denies                       [facility] Adult PT Treatment/Exercise - 04/15/18 0001      Exercises   Exercises  Knee/Hip;Lumbar      Lumbar Exercises: Quadruped   Other Quadruped Lumbar Exercises  Pt performed Lt knee flexion/extension with LLE extended in quadruped position; 10 reps:  Lt hip extension with knee flexed at 90 degrees 10 reps; Lt hip abduction with knee flexed and then extended for 10 reps each       Knee/Hip Exercises: Standing   Heel Raises  Both;1 set;10 reps   LLE only 1 set in closed chain position    Pt performed standing Lt hip external rotation with 3#  weight on LLE - touching cabinet door for target for Lt hip flexion and external Rotation strengthening  Standing Lt hip abduction then hip flexion with adduction with 3# weight 10 reps     Balance Exercises - 04/15/18 1130      Balance Exercises: Standing   Step Ups  Forward;6 inch;Intermittent UE support   backward; using LLE x 10 each    Lt 1/2 kneeling - head turns horizontal and vertical 5 reps each direction without UE support Lt 1/2 kneeling position with EC - added head turns slowly to challenge balance   TherAct; Jumping - bil. LE's x 5 reps; LLE single limb hopping attempted; pt tried jumping from floor onto 6" step with UE support prn 1/2 jumping jacks x 5 reps; lunges x 5 reps    PT Short Term Goals - 03/09/18 1251      PT SHORT TERM GOAL #1   Title  Improve Berg balance test score by >/= 5 points to reduce fall risk.  Will assess DGI to determine falls risk during gait    Baseline  49/56 -  assess DGI at 4 weeks for long term goals; 03/09/18 FGA 29/30     Time  4    Period  Weeks    Status  Achieved      PT SHORT TERM GOAL #2   Title  Improve TUG score from 28.44 secs to </= 23 secs with RW to demo improved mobility.    Baseline  28.44 secs wtih RW; 8.53 sec no device    Time  4    Period  Weeks    Status  Achieved      PT SHORT TERM GOAL #3   Title  Amb. 100' with SPC with CGA on flat, even surface.    Baseline  no device    Time  4    Period  Weeks    Status  Achieved      PT SHORT TERM GOAL #4   Title  Increase gait velocity from 1.11 ft/sec to >/= 1.5 ft/sec with RW for incr. gait efficiency.    Baseline  29.43 secs = 1.11 ft/sec; 03/09/18 3.52 ft/sec    Time  4    Period  Weeks    Status  Achieved      PT SHORT TERM GOAL #5   Title  Independent in HEP for LLE strengthening.    Time  4    Period  Weeks    Status  Achieved        PT Long Term Goals - 04/06/18 0946      PT LONG TERM GOAL #1   Title  Pt will improve DGI by 4 points to  decrease falls risk during gait    Status  Deferred      PT LONG TERM GOAL #2   Title  Improve TUG score from 28.44 secs to </= 18 secs with RW for incr. functional mobility.    Baseline  28.44 secs with RW; 03/09/18 8.53 seconds no device    Status  Achieved      PT LONG TERM GOAL #3   Title  Pt will be modified independent without device for household ambulation.    Status  Achieved      PT LONG TERM GOAL #4   Title  Amb. 500' on even/uneven surfaces with [facility] for increased community accessibility.      Baseline  No device needed - 04-05-18     Status  Achieved      PT LONG TERM GOAL #5   Title  Negotiate 4 steps with 1 rail using a step over step sequence.    Baseline  03/09/18 4 steps no rail, step over step sequence    Status  Achieved      PT LONG TERM GOAL #6   Title  Pt will demonstrate ability to flex and externally rotate LLE by placing Lt foot on Rt knee for increased ease with donning shoes and socks.      Time  4    Period  Weeks    Status  New    Target Date  05/05/18      PT LONG TERM GOAL #7   Title  Pt will demonstrate increased strength and coordination of LLE by jogging 115' around track in clinic gym without LOB.    Time  4    Period  Weeks    Status  New    Target Date  05/05/18      PT LONG TERM GOAL #8   Title  Pt will subjectively report  at least 30% increased strength in LLE for increased mobility endurance with standing/ambulation.    Time  4    Period  Weeks    Status  New    Target Date  05/05/18      PT LONG TERM GOAL  #9   TITLE  Independent in updated HEP for LLE strengthening.    Time  4    Period  Weeks    Status  New    Target Date  05/05/18            Plan - 04/15/18 1133    Clinical Impression Statement  Pt continues to improve with increasing LLE strength; LLE continues to tremor due to decr. muscle endurance and strength in Lt plantarflexors in closed chain position with activities requiring maintaining plantarflexed position  (such as amb. on tip toes);  increased AROM noted in LLE with Lt hip external rotation     Rehab Potential  Good    PT Frequency  2x / week    PT Duration  4 weeks    PT Treatment/Interventions  ADLs/Self Care Home Management;Aquatic Therapy;Functional mobility training;Stair training;Gait training;DME Instruction;Therapeutic activities;Therapeutic exercise;Balance training;Neuromuscular re-education;Patient/family education;Passive range of motion;Electrical Stimulation    PT Next Visit Plan  Give picture of Lt knee flexion for HEP:  continue LLE strengthening  - side step with resistance band - step ups and lateral step ups ; try quadruped    PT Home Exercise Plan  LLE strengthening - see pt instructions - add as appropriate    Consulted and Agree with Plan of Care  Patient       Patient will benefit from skilled therapeutic intervention in order to improve the following deficits and impairments:  Abnormal gait, Decreased activity tolerance, Decreased balance, Decreased coordination, Decreased range of motion, Decreased knowledge of use of DME, Decreased endurance, Decreased strength, Impaired vision/preception, Impaired tone, Impaired sensation, Impaired UE functional use  Visit Diagnosis: Muscle weakness (generalized)  Other abnormalities of gait and mobility     Problem List Patient Active Problem List   Diagnosis Date Noted  . Cerebral embolism with cerebral infarction 02/01/2018  . 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 Stroke-like symptoms   . Ataxia 01/31/2018  . Elevated blood pressure reading 01/31/2018  . Asthma     Jaquanda Wickersham, Donavan Burnet, PT 04/15/2018, 11:37 AM  [facility] [address] [place], [place], [address] Phone: [phone]   Fax:  [phone]  Name: Wayne Richards MRN: [id number] Date of Birth: February 02, 1956

## 2018-04-18 ENCOUNTER — Ambulatory Visit: Payer: Managed Care, Other (non HMO) | Admitting: Physical Therapy

## 2018-04-18 DIAGNOSIS — M6281 Muscle weakness (generalized): Secondary | ICD-10-CM | POA: Diagnosis not present

## 2018-04-18 DIAGNOSIS — R2689 Other abnormalities of gait and mobility: Secondary | ICD-10-CM

## 2018-04-19 ENCOUNTER — Encounter: Payer: Self-pay | Admitting: Physical Therapy

## 2018-04-19 NOTE — Therapy (Signed)
[facility] [address] [place], [place], [address] Phone: [phone]   Fax:  [phone]  Physical Therapy Treatment/DISCHARGE SUMMARY  Patient Details  Name: Wayne Richards MRN: [id number] Date of Birth: 1956-03-27 Referring Provider: Dr. Donnie Coffin   Encounter Date: 04/18/2018  PT End of Session - 04/19/18 1942    Visit Number  15    Number of Visits  20    Date for PT Re-Evaluation  05/05/18    Authorization Type  [employer] -  VL for PT/OT/ST combined: 90  If seen for all three visits on the same day it constitutes 3 visits    Authorization - Visit Number  28    Authorization - Number of Visits  90    PT Start Time  1020    PT Stop Time  1059    PT Time Calculation (min)  39 min       Past Medical History:  Diagnosis Date  . Asthma   . Stroke [facility])     Past Surgical History:  Procedure Laterality Date  . ACHILLES TENDON SURGERY      There were no vitals filed for this visit.  Subjective Assessment - 04/19/18 1938    Subjective  Pt states he is doing better - feels he is ready to finish up PT at this time - states he will continue to work on his own on strengthening his left leg     Pertinent History  ataxia; asthma: HTN: acute Rt pontine infarct:  subacute Lt pontine infarct     Patient Stated Goals  "Regain my normal function"    Currently in Pain?  No/denies          TherAct:  Pt performed standing LLE hip flexion with external rotation by standing on RLE & touching side of leg press machine as high as possible With LLE -10 reps; pt then performed with RLE (same movement of hip flexion with external rotation) with standing on LLE in plantarflexed position for Closed chain strengthening Pt performed jumping 10 reps inside // bars (no UE support needed); performed LLE single limb hopping 10 reps with entire foot contact Pt then performed LLE single limb hopping in plantarflexed position with bil.  UE support needed to clear floor - 10 reps 1/2 jumping jacks x 10 reps - no UE support Activities on mini- trampoline - jumping, LLE single limb hopping and lunges - all 10 reps each with UE support prn  NeuroRe-ed:  Amb. On tip toes 35' x 2 reps forward;  Pt performed Lt hip extension in prone position 10 reps; with knee flexed at 90 degrees 10 reps In quadruped position - Lt hip extension with knee held at 90 degrees of knee flexion - small pulses to ceiling 10 reps Lt hip flexion/extension x 10 reps in quadruped position  Pt jogged 115' around track in clinic gym                       PT Short Term Goals - 03/09/18 1251      PT SHORT TERM GOAL #1   Title  Improve Berg balance test score by >/= 5 points to reduce fall risk.  Will assess DGI to determine falls risk during gait    Baseline  49/56 - assess DGI at 4 weeks for long term goals; 03/09/18 FGA 29/30     Time  4    Period  Weeks    Status  Achieved      PT SHORT TERM GOAL #2   Title  Improve TUG score from 28.44 secs to </= 23 secs with RW to demo improved mobility.    Baseline  28.44 secs wtih RW; 8.53 sec no device    Time  4    Period  Weeks    Status  Achieved      PT SHORT TERM GOAL #3   Title  Amb. 100' with SPC with CGA on flat, even surface.    Baseline  no device    Time  4    Period  Weeks    Status  Achieved      PT SHORT TERM GOAL #4   Title  Increase gait velocity from 1.11 ft/sec to >/= 1.5 ft/sec with RW for incr. gait efficiency.    Baseline  29.43 secs = 1.11 ft/sec; 03/09/18 3.52 ft/sec    Time  4    Period  Weeks    Status  Achieved      PT SHORT TERM GOAL #5   Title  Independent in HEP for LLE strengthening.    Time  4    Period  Weeks    Status  Achieved        PT Long Term Goals - 04/18/18 1027      PT LONG TERM GOAL #1   Title  Pt will improve DGI by 4 points to decrease falls risk during gait    Baseline  (to be assessed at 4 weeks if BERG goal is met); FGA 29/30  therefore goal deferred    Status  Deferred      PT LONG TERM GOAL #2   Title  Improve TUG score from 28.44 secs to </= 18 secs with RW for incr. functional mobility.    Baseline  28.44 secs with RW; 03/09/18 8.53 seconds no device    Status  Achieved      PT LONG TERM GOAL #3   Title  Pt will be modified independent without device for household ambulation.    Status  Achieved      PT LONG TERM GOAL #4   Title  Amb. 500' on even/uneven surfaces with [facility] for increased community accessibility.      Status  Achieved      PT LONG TERM GOAL #5   Title  Negotiate 4 steps with 1 rail using a step over step sequence.    Status  Achieved      PT LONG TERM GOAL #6   Title  Pt will demonstrate ability to flex and externally rotate LLE by placing Lt foot on Rt knee for increased ease with donning shoes and socks.      Status  Partially Met      PT LONG TERM GOAL #7   Title  Pt will demonstrate increased strength and coordination of LLE by jogging 115' around track in clinic gym without LOB.    Status  Achieved      PT LONG TERM GOAL #8   Title  Pt will subjectively report at least 30% increased strength in LLE for increased mobility endurance with standing/ambulation.    Status  Achieved      PT LONG TERM GOAL  #9   TITLE  Independent in updated HEP for LLE strengthening.    Status  Achieved            Plan - 04/19/18 1945    Clinical Impression Statement  Pt has  met all LTG's with exception of recently upgraded LTG #6 with pt improving with ability to actively flex and externally rotate LLE to be able to place left foot on Rt knee for position for donning socks - pt is able to actively flex and rotate LLE but is not quite able to place Lt foot on Rt knee due to weakness in Lt hip flexors and external rotators.  Pt continues to have weakness in Lt gastrocs and in Lt hamstrings as well but overall gait and balance is now WNL's;  pt able to jog 115' in clinic gym without LOB.      Rehab  Potential  Good    PT Frequency  2x / week    PT Duration  4 weeks    PT Treatment/Interventions  ADLs/Self Care Home Management;Aquatic Therapy;Functional mobility training;Stair training;Gait training;DME Instruction;Therapeutic activities;Therapeutic exercise;Balance training;Neuromuscular re-education;Patient/family education;Passive range of motion;Electrical Stimulation    PT Next Visit Plan  N/A -  D/C on 04-18-18    PT Home Exercise Plan  LLE strengthening - see pt instructions - add as appropriate    Consulted and Agree with Plan of Care  Patient       Patient will benefit from skilled therapeutic intervention in order to improve the following deficits and impairments:  Abnormal gait, Decreased activity tolerance, Decreased balance, Decreased coordination, Decreased range of motion, Decreased knowledge of use of DME, Decreased endurance, Decreased strength, Impaired vision/preception, Impaired tone, Impaired sensation, Impaired UE functional use  Visit Diagnosis: Muscle weakness (generalized)  Other abnormalities of gait and mobility     Problem List Patient Active Problem List   Diagnosis Date Noted  . Cerebral embolism with cerebral infarction 02/01/2018  . Stroke (cerebrum) ([place]) 02/01/2018  . Stroke-like symptoms   . Ataxia 01/31/2018  . Elevated blood pressure reading 01/31/2018  . Asthma     PHYSICAL THERAPY DISCHARGE SUMMARY  Visits from Start of Care: 15  Current functional level related to goals / functional outcomes: See above for progress towards goals - all goals met   Remaining deficits: Pt continues to have weakness in Lt hip extensors, plantarflexors and hamstrings but not significantly impacting function as gait and balance are WNL's   Education / Equipment: Pt has been instructed in HEP for LLE strengthening; pt reports he is currently going to Yoga classes for exercise also Plan: Patient agrees to discharge.  Patient goals were met. Patient is  being discharged due to meeting the stated rehab goals.  ?????        Alda Lea, PT 04/19/2018, 7:56 PM  [facility] [address] [place], [place], [address] Phone: [phone]   Fax:  [phone]  Name: Wayne Richards MRN: [id number] Date of Birth: [birth year]

## 2018-05-12 DIAGNOSIS — Z0271 Encounter for disability determination: Secondary | ICD-10-CM

## 2018-05-23 NOTE — Progress Notes (Signed)
[facility] [address] [place]. [place] [address] [phone]       OFFICE FOLLOW UP NOTE  Mr. Wayne Richards Date of Birth:  October 07, 1955 Medical Record Number:  [id number]   Reason for visit: Stroke follow up  CHIEF COMPLAINT:  Chief Complaint  Patient presents with  . Follow-up    Follow up for Stroke room in back hallway pt alone    HPI: Wayne Richards is being seen today in the office for acute right pontine infarct on 01/31/2018. History obtained from patient and chart review. Reviewed all radiology images and labs personally.  Mr. [name] is a 62 y.o. male with history of vertigo, HTN and HLD who presented after 1 week of incoordination of L side along with some numbness in left hand.  CT head reviewed and was negative for acute stroke.  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  MRA neck showed patent carotid and vertebral arteries without hemodynamically significant stenosis.  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  2D echo within normal EF and negative for PFO.  LDL 150 and as patient was not on statin PTA is recommended to start Lipitor 80 mg daily.  A1c satisfactory at 5.8.  Patient was not on antithrombotic PTA and recommended DAPT with aspirin and Plavix for 3 weeks and then aspirin alone as it was believed etiology likely from small vessel disease.  Patient was discharged home with recommendations of continued PT/OT.  02/21/2018 visit: Patient is being seen today for hospital follow-up and is accompanied by his wife.  Patient was told to come to [facility] waiting room after therapy session due to a nurse from the insurance company advised him that he could have a new brain hemorrhage due to possible increase in left-sided weakness and facial droop but per wife, she did notice the symptoms in the hospital but did  not report them to staff due to her believing they are already aware.  PCP did order CT scan which was completed on 02/10/2018 which was negative for acute infarct or hemorrhage.  Patient does continue to have residual left-sided weakness but states this has been improving.  Patient continues to attend PT/OT at our neuro rehab clinic and has been making daily improvements.  He does state that he notices mild increase in weakness when he is fatigued or with increased stress.  Patient continues to take both aspirin and Plavix without side effects of bleeding or bruising.  Continues to take Lipitor with possible myalgias but patient is able to tolerate at this time and feels as though these pains have been improving.  Blood pressure mildly elevated at 142/87 and his patient does monitor this at home, he states it is typically lower.  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  Patient was educated on post stroke depression and was advised that this is something that can improve over time or this could be a lifelong issue that he may have to be treated for.  Patient would rather hold off at this time on starting any depression medications and continue to monitor.  Long discussion regarding small vessel disease and stroke risk factor modification and management.  Denies new or worsening stroke/TIA symptoms.  Interval history 05/24/2018: Patient is being seen today for follow-up visit.  He continues to have left hemiparesis and when asked if he has gained any improvement, he states "other people say that I have but I do not see it".  He also continues to have cognitive difficulties such as processing information and short-term memory.  He does continue to take aspirin without side effects of bleeding or bruising.  He currently is taking atorvastatin 80 mg daily 3 times weekly due to possible statin myalgias as recommended  by PCP.  He does continue to have intermittent myalgias that have slightly reduced since decreasing dose but have not resolved.  He is currently taking co-Q10 100 mg 3 times daily which helps some.  He has completed therapies at this time but continues to participate at his local gym with a yoga and strength exercising programs.  Recent lab work obtained by PCP on 04/20/2018 showed total cholesterol 101, HDL 33, triglycerides 82 and LDL 52.  Patient becomes tearful during appointment as he states that he has a difficult time accepting that this is a new him and that he will be unable to play golf and is frustrated with his lack of improvement.  He has only been doing some of the exercises that were recommended during therapy as he does not understand the reasoning behind "putting little pegs in holes".  He continues to decline depression/anxiety since his stroke which was also discussed at prior appointment. He states that typically he is stable but becomes upset when he speaks about his stroke and "lack of progress".  Denies new or worsening stroke/TIA symptoms.   ROS:   14 system review of systems performed and negative with exception of fatigue, joint pain, aching muscles, memory loss, weakness, insomnia, decreased energy and disinterest in activities   PMH:  Past Medical History:  Diagnosis Date  . Asthma   . Stroke [facility])     PSH:  Past Surgical History:  Procedure Laterality Date  . ACHILLES TENDON SURGERY      Social History:  Social History   Socioeconomic History  . Marital status: Married    Spouse name: Not on file  . Number of children: Not on file  . Years of education: Not on file  . Highest education level: Not on file  Occupational History  . Not on file  Social Needs  . Financial resource strain: Not on file  . Food insecurity:    Worry: Not on file    Inability: Not on file  . Transportation needs:    Medical: Not on file    Non-medical: Not on file  Tobacco Use   . Smoking status: Never Smoker  . Smokeless tobacco: Never Used  Substance and Sexual Activity  . Alcohol use: Yes    Frequency: Never    Comment: SOCIAL  . Drug use: Never  . Sexual activity: Not on file  Lifestyle  . Physical activity:    Days per week: Not on file    Minutes per session: Not on file  . Stress: Not on file  Relationships  . Social connections:    Talks on phone: Not on file    Gets together: Not on file    Attends religious service: Not on file    Active member of club or organization: Not on file    Attends meetings of clubs or organizations: Not on file    Relationship status: Not on file  . Intimate partner violence:    Fear of current or ex partner: Not on file  Emotionally abused: Not on file    Physically abused: Not on file    Forced sexual activity: Not on file  Other Topics Concern  . Not on file  Social History Narrative  . Not on file    Family History:  Family History  Problem Relation Age of Onset  . Hypertension Father   . Stroke Father   . Cardiomyopathy Brother     Medications:   Current Outpatient Medications on File Prior to Visit  Medication Sig Dispense Refill  . aspirin EC 81 MG EC tablet Take 1 tablet (81 mg total) by mouth daily. 30 tablet 1  . Coenzyme Q10 (COQ10) 100 MG CAPS Take by mouth 3 (three) times daily.    . famotidine (PEPCID) 20 MG tablet Take 1 tablet (20 mg total) by mouth 2 (two) times daily as needed (with Ibuprofen). 60 tablet 0  . salmeterol (SEREVENT DISKUS) 50 MCG/DOSE diskus inhaler Inhale 1 puff into the lungs 2 (two) times daily.     No current facility-administered medications on file prior to visit.     Allergies:   Allergies  Allergen Reactions  . Iodine Hives, Itching, Swelling and Rash  . Peanut-Containing Drug Products Hives, Itching and Swelling  . Shellfish Allergy Hives, Itching and Swelling  . Penicillins Hives, Swelling and Rash     Physical Exam  Vitals:   05/24/18 0912    BP: 133/74  Pulse: 62  Weight: 236 lb 11.2 oz (107.4 kg)  Height: 6\' 1"  (1.854 m)   Body mass index is 31.23 kg/m. No exam data present  General: well developed, well nourished, pleasant middle-aged African-American male, seated, in no evident distress Head: head normocephalic and atraumatic.   Neck: supple with no carotid or supraclavicular bruits Cardiovascular: regular rate and rhythm, no murmurs Musculoskeletal: no deformity Skin:  no rash/petichiae Vascular:  Normal pulses all extremities  Neurologic Exam Mental Status: Awake and fully alert. Oriented to place and time. Recent and remote memory intact. Attention span, concentration and fund of knowledge appropriate. Mood and affect appropriate but tearful at times during appointment Cranial Nerves: Fundoscopic exam reveals sharp disc margins. Pupils equal, briskly reactive to light. Extraocular movements full without nystagmus. Visual fields full to confrontation. Hearing intact. Facial sensation intact. Face, tongue, palate moves normally and symmetrically.  Motor: Normal bulk and tone.  4-/5 LLE (prior 3+/5) and 5/5 LUE (prior 4+/5)  right side 5/5 Sensory.: intact to touch , pinprick , position and vibratory sensation.  Coordination: Rapid alternating movements normal on right side. Finger-to-nose and heel-to-shin performed accurately on right side.  Slightly orbits right arm over left.  Decreased finger dexterity on left hand. Gait and Station: Arises from chair without difficulty. Stance is normal. Gait demonstrates hemiplegic gait pattern on left lower extremity with mild dragging of left foot.  Reflexes: 1+ and symmetric. Toes downgoing.       Diagnostic Data (Labs, Imaging, Testing)  CT head without contrast 01/31/2018 IMPRESSION: 1.  No evidence of acute intracranial abnormality. 2. Mild paranasal sinusitis of uncertain chronicity, probably chronic.  MRI brain without contrast 02/01/2018 IMPRESSION: Acute and  subacute infarctions affecting the pons. 6 mm probably subacute infarction in the left side of the pons. Larger, more recent infarction in the right side of the pons, with fairly low level diffusion disturbance at this time. No evidence of swelling or hemorrhage.   MR MRA head without contrast MR MRA neck without contrast 02/01/2018 IMPRESSION: MRA neck: Patent carotid and vertebral arteries in  the neck. No hemodynamically significant stenosis by NASCET criteria, occlusion, or aneurysm. 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 No additional large vessel occlusion of the intracranial circulation.    ASSESSMENT: Wayne Richards is a 62 y.o. 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 Vascular risk factors include HTN, HLD and DM.  Patient is being seen today for follow-up appointment and has been stable from a stroke standpoint with residual left hemiparesis and cognitive difficulties.    PLAN: -Continue aspirin 81 mg daily secondary stroke prevention -Due to continued myalgias, recommend to stop Lipitor at this time and start Crestor 40 mg daily -F/u with PCP regarding your HLD and HTN management  -Referral placed for ST to assist with cognitive concerns -Advised to stay active along with ensuring he is performing HEP exercises provided during therapy sessions along with exercises that were provided during appointment for fine motor control weakness -Long discussion with patient regarding his frustrations with his perceived lack of improvement.  Advised him that since prior appointment, he has made progress and improvement in his left hemiparesis.  Educated patient on length of time that he will see improvement as it can take up to a full year to see for improvement and at this time he is only 4 months post stroke.  Educated patient on reasoning behind exercises that were recommended after  PT/OT and despite seeming simple and minute, these are beneficial to continue to do to help retrain his brain.  Also advised patient that he needs to compare his current deficits with his deficits that he had during hospitalization in July and not to compare his current deficits to his baseline prior to the stroke.  Also long discussion regarding depression/anxiety post stroke along with having situational depression/anxiety that can also make his memory worse along with slowing down his progress.  At this time, he continues to decline antidepression medication therefore it was recommended for him to speak to a therapist but patient declines this as well stating he has family members that he talks to.  Highly encouraged patient to take it day by day at this time, continue to do exercises are recommended during therapies and continue current exercise regimen with eating a healthy diet and to congratulate himself on his current accomplishments. -continue to monitor BP at home -Advised to continue to stay active and eat a healthy diet -Maintain strict control of hypertension with blood pressure goal below 130/90, diabetes with hemoglobin A1c goal below 6.5% and cholesterol with LDL cholesterol (bad cholesterol) goal below 70 mg/dL. I also advised the patient to eat a healthy diet with plenty of whole grains, cereals, fruits and vegetables, exercise regularly and maintain ideal body weight.  Follow up in 6 months or call earlier if needed   Greater than 50% of time during this 45 minute visit was spent on counseling,explanation of diagnosis of right pons infarct and subacute left pontine infarct, reviewing risk factor management of HLD and HTN, planning of further management, discussion with patient and family and coordination of care.   George Hugh, AGNP-BC  [facility] [address] [place], [place] [address]  Phone [phone] Fax [phone]

## 2018-05-24 ENCOUNTER — Ambulatory Visit: Payer: 59 | Admitting: Adult Health

## 2018-05-24 ENCOUNTER — Encounter: Payer: Self-pay | Admitting: Adult Health

## 2018-05-24 VITALS — BP 133/74 | HR 62 | Ht 73.0 in | Wt 236.7 lb

## 2018-05-24 DIAGNOSIS — I63513 Cerebral infarction due to unspecified occlusion or stenosis of bilateral middle cerebral arteries: Secondary | ICD-10-CM | POA: Diagnosis not present

## 2018-05-24 DIAGNOSIS — I10 Essential (primary) hypertension: Secondary | ICD-10-CM

## 2018-05-24 DIAGNOSIS — I69319 Unspecified symptoms and signs involving cognitive functions following cerebral infarction: Secondary | ICD-10-CM

## 2018-05-24 DIAGNOSIS — E7800 Pure hypercholesterolemia, unspecified: Secondary | ICD-10-CM

## 2018-05-24 MED ORDER — ROSUVASTATIN CALCIUM 20 MG PO TABS: 20.0000 mg | ORAL_TABLET | Freq: Every day | ORAL | 3 refills | Status: DC

## 2018-05-24 NOTE — Patient Instructions (Addendum)
Continue aspirin 81 mg daily  and start crestor 20mg  and stop lipitor  for secondary stroke prevention  Continue to follow up with PCP regarding cholesterol and blood pressure management   Referral placed for speech therapy to assist with cognitive concerns  Stay active along with continuation of current therapy exercises and fine motor control exercises   Please don't be so hard on yourself! You are only 4 months out at this time! You are doing everything you can right now to improve and get better...important to continue doing therapy exercises despite how small they seem to be. Your brain needs to heal and retrain and compensate for the area of damage. This takes time!! It can take up to a whole year to heal....take it day by day and do not compare your progress to how you were prior to the stroke. Compare to how you were during the hospital and congratulate yourself for your accomplishments you have made so far!! Please reach out if you would like to start on any medications to help with depression/anxiety or speak to therapist.   Continue to monitor blood pressure at home  Maintain strict control of hypertension with blood pressure goal below 130/90, diabetes with hemoglobin A1c goal below 6.5% and cholesterol with LDL cholesterol (bad cholesterol) goal below 70 mg/dL. I also advised the patient to eat a healthy diet with plenty of whole grains, cereals, fruits and vegetables, exercise regularly and maintain ideal body weight.  Followup in the future with me in 6 months or call earlier if needed       Thank you for coming to see [place] at [facility]. I hope we have been able to provide you high quality care today.  You may receive a patient satisfaction survey over the next few weeks. We would appreciate your feedback and comments so that we may continue to improve ourselves and the health of our patients.

## 2018-05-29 NOTE — Progress Notes (Signed)
I agree with the above plan 

## 2018-06-15 DIAGNOSIS — Z0271 Encounter for disability determination: Secondary | ICD-10-CM

## 2018-07-05 ENCOUNTER — Encounter: Payer: Self-pay | Admitting: Adult Health

## 2018-07-22 ENCOUNTER — Ambulatory Visit: Payer: 59 | Attending: Adult Health

## 2018-07-22 DIAGNOSIS — R41841 Cognitive communication deficit: Secondary | ICD-10-CM | POA: Insufficient documentation

## 2018-07-22 NOTE — Patient Instructions (Addendum)
Memory Compensation Strategies   1. Use "WARM" strategy. W= write it down A=  associate it R=  repeat it M=  make a mental picture  2. You can keep a Glass blower/designerMemory Notebook. Use a 3-ring notebook with sections for the following:  calendar, important names and phone numbers, medications, doctors' names/phone numbers, "to do list"/reminders, and a section to journal what you did each day  3. Use a calendar to write appointments down.  4. Write yourself a schedule for the day.  This can be placed on the calendar or in a separate section of the Memory Notebook.  Keeping a regular schedule can help memory.  5. Use medication organizer with sections for each day or morning/evening pills  You may need help loading it  6. Keep a basket, or pegboard by the door.   Place items that you need to take out with you in the basket or on the pegboard.  You may also want to include a message board for reminders.  7. Use sticky notes. Place sticky notes with reminders in a place where the task is performed.  For example:  "turn off the stove" placed by the stove, "lock the door" placed on the door at eye level, "take your medications" on the bathroom mirror or by the place where you normally take your medications.  8.  Pre-plan the steps for longer tasks, and before moving on to the next    task/step, ensure you've followed each step prior to moving on   9.  Use alarms/timers.  Use while cooking to remind yourself to check on food or as a reminder to take your medicine, or as a reminder to make a call, or as a reminder to perform another task, etc.  10. Use your phone ("voice recorder" app) or a small tape recorder to record important information and notes for yourself.   ======================================   App = "constant therapy"  FREE for 15 days After that they ask you for a subscription

## 2018-07-25 ENCOUNTER — Ambulatory Visit: Payer: 59

## 2018-07-25 ENCOUNTER — Other Ambulatory Visit: Payer: Self-pay | Admitting: Adult Health

## 2018-07-25 DIAGNOSIS — R41841 Cognitive communication deficit: Secondary | ICD-10-CM | POA: Diagnosis not present

## 2018-07-25 DIAGNOSIS — I69319 Unspecified symptoms and signs involving cognitive functions following cerebral infarction: Secondary | ICD-10-CM

## 2018-07-25 DIAGNOSIS — F329 Major depressive disorder, single episode, unspecified: Secondary | ICD-10-CM

## 2018-07-25 NOTE — Therapy (Signed)
[facility] [address] [place], [place], [address] Phone: [phone]   Fax:  [phone]  Speech Language Pathology Treatment  Patient Details  Name: Wayne Richards MRN: [id number] Date of Birth: 04-29-1956 Referring Provider (SLP): Wayne Richards, Jessica, NP   Encounter Date: 07/25/2018  End of Session - 07/25/18 1250    Visit Number  2    Number of Visits  17    Date for SLP Re-Evaluation  10/20/18    SLP Start Time  1109   pt 8 minutes late   SLP Stop Time   1150    SLP Time Calculation (min)  41 min    Activity Tolerance  Patient tolerated treatment well       Past Medical History:  Diagnosis Date  . Asthma   . Stroke [facility](HCC)     Past Surgical History:  Procedure Laterality Date  . ACHILLES TENDON SURGERY      There were no vitals filed for this visit.  Subjective Assessment - 07/25/18 1111    Subjective  Pt sat in wrong chair upon entering ST room. 8 miunutes late today.    Currently in Pain?  Yes    Pain Score  4     Pain Location  Neck    Pain Orientation  Left    Pain Descriptors / Indicators  Burning    Pain Type  Chronic pain    Pain Radiating Towards  down to lt knee through lt quad    Pain Onset  More than a month ago    Pain Frequency  Constant            ADULT SLP TREATMENT - 07/25/18 1115      General Information   Behavior/Cognition  Alert;Cooperative;Lethargic   "I miss the [name]."     Treatment Provided   Treatment provided  Cognitive-Linquistic      Cognitive-Linquistic Treatment   Treatment focused on  Cognition    Skilled Treatment  SLP planned to continue and conclude cognitive communication testing today, however pt enters ST room with extremely flat affect. As pt has provided most examples of cognitive communication deficits stemming from decr'd attention, SLP suggested to pt to talk to his MD about an attention med to assist with sustained/selective attention.  Pt stated his wife has been prescribed an attention med and he hasn't noted any change with her. SLP asked him if in HIS case, the benefit of positive change outweighed the risk for no change, pt stated there were other side effects of attention meds that outweighed any benefit the med would have. SLP asked pt if he had looked at and incorporated any of the compensations provided from previous session: "I don't see how a sticky note would be helpful." SLP then used a practical example of a frustrating pt situation to show a sticky note may assist. "Well, I'll try the (sticky note) and see if that helps what should be normal habit for me." Pt provided another situation last night where pt was listening to a long story from a friend and forgot what the story was about. SLP suggested pt could incorporate repetition of details of the story as natural interjections, and pt stated he would not have wanted to interrupt friend as he was telling his story. SLP told pt SLP thinks pt would benefit from neuropsych services to cope with changes post-CVA. SLP told pt about data stating pt has to be willing and able to commit to  being "full throttle" about rehab for him to receive most benefit. SLP encouraged pt towards forward thinking instead of "backward thinking" at "old Wayne Richards" and instead focus thoughts on comparison with "new Wayne Richards" 4-8 weeks ago to the present. Pt stated concern that he was "settling" for the skills he has. SLP told pt to bring device with Constant Therapy next session and SLP can tweak tasks that would benefit pt most.       Assessment / Recommendations / Plan   Plan  Consult other service (comment)   Neurospych - SLP to notify NP     Progression Toward Goals   Progression toward goals  Not progressing toward goals (comment)   depresssion/focus on changes since CVA      SLP Education - 07/25/18 1250    Education Details  neuropsych services, compensations for cognition/memory    Person(s)  Educated  Patient    Methods  Explanation;Handout    Comprehension  Verbalized understanding       SLP Short Term Goals - 07/25/18 1256      SLP SHORT TERM GOAL #1   Title  pt will use at least two memory strategies/memory system between 3 sessions to incr success with short term memory    Time  4    Period  Weeks    Status  On-going      SLP SHORT TERM GOAL #2   Title  pt will report frustration with deficits as 5.5 or less (10=most frustrating) at visit #8    Time  4    Period  Weeks    Status  On-going      SLP SHORT TERM GOAL #3   Title  pt will complete formal cognitive linguistic testing by session #4    Time  2    Period  --   visits   Status  Revised       SLP Long Term Goals - 07/25/18 1256      SLP LONG TERM GOAL #1   Title  pt will report using/use memory system between 4 ST sessions    Time  8    Period  Weeks   or 17 total visits, for all LTGs   Status  On-going      SLP LONG TERM GOAL #2   Title  pt will report frustration with deficits at 5 or less (10=most frustrating) in two appointments at or after visit #14    Time  8    Period  Weeks    Status  On-going       Plan - 07/25/18 1251    Clinical Impression Statement  Pt presents with lingering cognitive communication deficits including at least memory and attention - further testing was to take place today however SLP believed pt to be in a state that further cognitive testing would not have been benficial to pt today. Pt cont to exhibit overt s/s depression regarding his deficits and neuropsychological evaluation/treatment may be in patient's best interest prior to completing more ST. Pt may benefit from skilled ST to address cognitive linguistic deficits to improve his skills more to PLOF if pt can focus more outlook for positive change rather than what skills he has lost.    Speech Therapy Frequency  2x / week    Duration  --   8 weeks/17 sessions   Treatment/Interventions  Cueing hierarchy;SLP  instruction and feedback;Compensatory strategies;Functional tasks;Cognitive reorganization;Internal/external aids;Patient/family education    Potential to Achieve Goals  Fair  Potential Considerations  Co-morbidities;Cooperation/participation level;Other (comment)   presence of s/s not unlike depression      Patient will benefit from skilled therapeutic intervention in order to improve the following deficits and impairments:   Cognitive communication deficit    Problem List Patient Active Problem List   Diagnosis Date Noted  . Cerebral embolism with cerebral infarction 02/01/2018  . 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 Stroke-like symptoms   . Ataxia 01/31/2018  . Elevated blood pressure reading 01/31/2018  . Asthma     Una Yeomans ,MS, CCC-SLP  07/25/2018, 12:58 PM  [facility] [address] [place], [place], [address] Phone: [phone]   Fax:  [phone]   Name: Wayne Richards MRN: [id number] Date of Birth: [birth year]

## 2018-07-25 NOTE — Patient Instructions (Addendum)
   Look at Constant Therapy app between appointments and begin doing some of the tasks on that app. Do at least 30 minutes each day. Bring your device that has Constant Therapy next session.  Ask your wife what other compensations she thinks may be helpful for you, from the sheet from last session.  Neuropsychological services:  Dr. Arley PhenixJohn Rodenbough [phone]- [phone] (office of [facility])  Will be helpful for you to work through thoughts and feelings about changes post-CVA, to benefit most from the rehab you need to  progress with cognitive skills

## 2018-07-25 NOTE — Progress Notes (Signed)
Per review of prior office visit, patient was also noted to have a flat affect and negative outlook on continued therapies due to lack of progress.  He continues to deny any type of depression and/or anxiety.  Neuropsych evaluation referral placed.  Thank you for this update. Shanda Bumps-Primrose Oler, NP

## 2018-07-25 NOTE — Therapy (Signed)
[facility] [address] [place], [place], [address] Phone: [phone]   Fax:  [phone]  Speech Language Pathology Evaluation  Patient Details  Name: Wayne Richards MRN: [id number] Date of Birth: Aug 15, 1955 Referring Provider (SLP): George Hugh, NP   Encounter Date: 07/22/2018  End of Session - 07/25/18 1030    Visit Number  1    Number of Visits  17    Date for SLP Re-Evaluation  10/20/18    SLP Start Time  0934    SLP Stop Time   1015    SLP Time Calculation (min)  41 min    Activity Tolerance  Patient tolerated treatment well       Past Medical History:  Diagnosis Date  . Asthma   . Stroke [facility])     Past Surgical History:  Procedure Laterality Date  . ACHILLES TENDON SURGERY      There were no vitals filed for this visit.  Subjective Assessment - 07/25/18 1038    Subjective  Pt endorses difficulty with selective attention, and memory ("forgetfulness"). Pt relates his level of frustration as a 6/10 (10=most frustrated) regarding his current cognitive abilities.    Currently in Pain?  Yes    Pain Score  4     Pain Location  Neck    Pain Orientation  Left    Pain Descriptors / Indicators  Burning    Pain Type  Chronic pain    Pain Radiating Towards  lt knee    Pain Onset  More than a month ago    Pain Frequency  Constant    Aggravating Factors   moving head and neck to lt, and moving lt arm    Pain Relieving Factors  activity    Effect of Pain on Daily Activities  "I have to learn to live with it"         SLP Evaluation [facility] - 07/25/18 0001      SLP Visit Information   SLP Received On  07/22/18    Referring Provider (SLP)  George Hugh, NP    Onset Date  01-25-18 (presented ED 01-31-18)    Medical Diagnosis  CVA      Pain Assessment   Pain Onset  More than a month ago      General Information   HPI  62 y.o. male with medical history significant of asthma, borderline  hypertension, vertigo, who presented 01-31-18 with left hand numbness and tingling, dizziness and difficulty walking since approx 01-25-18. MRI cute and subacute infarctions affecting the pons. 6 mm probably subacute infarction in the left side of the pons. Larger, more recent infarction in the right side of the pons. During intial ST eval in July pt reported his skills had declined since d/c from hospital on 02-03-18. Initial ST eval was on 02-10-18 and he declined ST services (dysarthria and dysphagia) at that time, wanting to focus on OT/PT. Pt d/c'd from OT/PT in September 2019 - OT note states pt was strongly encourged to return to premorbid social activities (golf, social gatherings with friends) gradually. Recently, MD notes state pt unhappy with his lack of progress. ST orders written.      Balance Screen   Has the patient fallen in the past 6 months  No      Prior Functional Status   Cognitive/Linguistic Baseline  Within functional limits    Type of Home  House      Cognition   Overall Cognitive Status  Impaired/Different from baseline    Area of Impairment  Attention;Memory    Attention Comments  pt reports anecdotal examples of decr'd sustained and selective attention    Memory  Decreased short-term memory    Memory Comments  Pt reports he forgets afternoon meds occasionally (doesn't eat at same place as breakfast or dinner), forgets calendar items (when daughter coming home or returning to classes), writing things down, asking wife to remember things for him    Behaviors  Other (comment)   pt with frequent self-defeating language     Auditory Comprehension   Overall Auditory Comprehension  Appears within functional limits for tasks assessed      Verbal Expression   Overall Verbal Expression  Appears within functional limits for tasks assessed      Oral Motor/Sensory Function   Overall Oral Motor/Sensory Function  Appears within functional limits for tasks assessed      Motor Speech    Overall Motor Speech  Appears within functional limits for tasks assessed      Standardized Assessments   Standardized Assessments   Other Assessment   Hopkins Verbal Learning Test   Other Assessment  RECALL: 4/12 (below WNL), 6/12 (below WNL), and 7/12 (below WNL). RECOGNITION: 12/12 true positives (WNL). This testing suggests pt has primarily a retrieval deficit rather than a storage deficit.                       SLP Education - 07/25/18 1030    Education Details  eval results, possible goals, memory compensations    Person(s) Educated  Patient    Methods  Explanation;Handout;Demonstration    Comprehension  Verbalized understanding;Need further instruction       SLP Short Term Goals - 07/22/18 1637      SLP SHORT TERM GOAL #1   Title  pt will use at least two memory strategies/memory system between 3 sessions to incr success with short term memory    Time  4    Period  Weeks    Status  New      SLP SHORT TERM GOAL #2   Title  pt will report frustration with deficits as 5.5 or less (10=most frustrating) at visit #8    Time  4    Period  Weeks    Status  New      SLP SHORT TERM GOAL #3   Title  pt will complete formal cognitive linguistic testing by session #3    Time  2    Period  --   visits   Status  New       SLP Long Term Goals - 07/22/18 1640      SLP LONG TERM GOAL #1   Title  pt will report using/use memory system between 4 ST sessions    Time  8    Period  Weeks   or 17 total visits, for all LTGs   Status  New      SLP LONG TERM GOAL #2   Title  pt will report frustration with deficits at 5 or less (10=most frustrating) in two appointments at or after visit #14    Time  8    Period  Weeks    Status  New       Plan - 07/25/18 1031    Clinical Impression Statement  Pt presents with lingering cognitive communication deficits including at least memory and attention - further testing to follow. Scores on [employer] Verbal  Learning Test  (Recall) were outside WNL, but Recognition section were WNL, suggesting a recall and not encoding memory deficit. Pt also has reported s/s of attention deficits and more cognitive linguistic deficits may be present - further testing is necessary and will be completed ASAP. Pt exhibits s/s depression regarding his deficits and may benefit from neuropsychological evaluation in the future. Pt would benefit from skilled ST to address cognitive linguistic deficits to improve his skills more to PLOF, as well as to improve QOL.     Speech Therapy Frequency  2x / week    Duration  --   8 weeks, or 17 visits   Treatment/Interventions  Cueing hierarchy;SLP instruction and feedback;Compensatory strategies;Functional tasks;Cognitive reorganization;Internal/external aids;Patient/family education    Potential to Achieve Goals  Good    Potential Considerations  Co-morbidities;Cooperation/participation level   depression?   SLP Home Exercise Plan  memory compensations    Consulted and Agree with Plan of Care  Patient       Patient will benefit from skilled therapeutic intervention in order to improve the following deficits and impairments:   Cognitive communication deficit - Plan: SLP plan of care cert/re-cert    Problem List Patient Active Problem List   Diagnosis Date Noted  . Cerebral embolism with cerebral infarction 02/01/2018  . 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 Stroke-like symptoms   . Ataxia 01/31/2018  . Elevated blood pressure reading 01/31/2018  . Asthma     Cherryl Babin ,MS, CCC-SLP  07/25/2018, 10:39 AM  [facility] [address] [place], [place], [address] Phone: [phone]   Fax:  [phone]  Name: Nilda SimmerFrank Cieslinski MRN: [id number] Date of Birth: February 02, 1956

## 2018-08-01 NOTE — Progress Notes (Signed)
I would have to agree with you if this is the case.  As you well know, he will not benefit from continued therapy sessions in this current state of mind.  Please keep me updated.  I appreciate the updates. Shanda Bumps-Cori Henningsen, NP

## 2018-08-03 ENCOUNTER — Ambulatory Visit: Payer: 59 | Attending: Adult Health

## 2018-08-03 DIAGNOSIS — R41841 Cognitive communication deficit: Secondary | ICD-10-CM | POA: Diagnosis not present

## 2018-08-03 NOTE — Patient Instructions (Signed)
Do Constant Therapy at least 30 minutes today, tomorrow, and Friday prior to coming here.  Ask some of your family you trust about the opportunity to see a neuropsychologist and see what they say.

## 2018-08-03 NOTE — Therapy (Signed)
[facility] [address] [place], [place], [address] Phone: [phone]   Fax:  [phone]  Speech Language Pathology Treatment  Patient Details  Name: Wayne SimmerFrank Richards MRN: [id number] Date of Birth: [birth year] Referring Provider (SLP): George HughVanschaick, Jessica, NP   Encounter Date: 08/03/2018  End of Session - 08/03/18 1322    Visit Number  3    Number of Visits  17    Date for SLP Re-Evaluation  10/20/18    SLP Start Time  0852    SLP Stop Time   0933    SLP Time Calculation (min)  41 min    Activity Tolerance  Patient tolerated treatment well       Past Medical History:  Diagnosis Date  . Asthma   . Stroke [facility](HCC)     Past Surgical History:  Procedure Laterality Date  . ACHILLES TENDON SURGERY      There were no vitals filed for this visit.  Subjective Assessment - 08/03/18 0856    Subjective  Pt did not do Constant Therapy until last night and this AM.     Currently in Pain?  Yes    Pain Score  4     Pain Location  Neck    Pain Orientation  Left    Pain Descriptors / Indicators  Burning    Pain Type  Chronic pain    Pain Radiating Towards  hip    Pain Onset  More than a month ago    Pain Frequency  Constant    Aggravating Factors   "pulling" when I move head to lt    Pain Relieving Factors  activity            ADULT SLP TREATMENT - 08/03/18 0857      General Information   Behavior/Cognition  Alert;Cooperative;Lethargic      Treatment Provided   Treatment provided  Cognitive-Linquistic      Cognitive-Linquistic Treatment   Treatment focused on  Cognition    Skilled Treatment  Pt not holding eye contact with SLP upon sitting down in ST room. Pt brought up Constant Therapy on his tablet, and SLP asked pt to begin with his scheduled tasks. Pt began, saying, "I don't see how this is going to help my memory." Pt had little difficulty with design identification, but had more trouble with auditory  commands with above/below, left/right, and with 5-step pattern ID. After approx 7 minutes pt stated, "My brain can't think anymore" and he tossed pencil onto the table. SLP encouraged pt to use his finger and/or the pencil to touch the pattern as it is revealed for incr'd attention/memory. Pt did so however after two attempts stated disappointment with his performance.SLP had Wayne Richards discussion with pt at this time about recommendation to neuropsych. Pt significantly downplayed any assistance a neuropsychologist could provide. SLP read pt's last note in OT stressing a return gradually to golf game and other things pt enjoyed prior to CVA. Pt stated he would not return to golf due to not playing the same way he did premorbidly. SLP told pt that neuropsycholoist would assist pt in changing mindset to accept changes while striving for return of premorbid abilities and skills. Pt again told SLP he no longer desires golf due to inability to play like he used to play.       Assessment / Recommendations / Plan   Plan  Consult other service (comment)   neuropsych referral made- SLP to d/c if pt still  depressed     Progression Toward Goals   Progression toward goals  Not progressing toward goals (comment)   pt cont focus on "old Wayne Richards"      SLP Education - 08/03/18 1321    Education Details  benefits of neuropsych, pt appears focused on premorbid skills being different now and hindering his progress in the present     Person(s) Educated  Patient    Methods  Explanation    Comprehension  Need further instruction       SLP Short Term Goals - 08/03/18 1327      SLP SHORT TERM GOAL #1   Title  pt will use at least two memory strategies/memory system between 3 sessions to incr success with short term memory    Time  3    Period  Weeks    Status  On-going      SLP SHORT TERM GOAL #2   Title  pt will report frustration with deficits as 5.5 or less (10=most frustrating) at visit #8    Time  3    Period   Weeks    Status  On-going      SLP SHORT TERM GOAL #3   Title  pt will complete formal cognitive linguistic testing by session #4    Time  1    Period  --   visits   Status  Revised       SLP Long Term Goals - 08/03/18 1327      SLP LONG TERM GOAL #1   Title  pt will report using/use memory system between 4 ST sessions    Time  7    Period  Weeks   or 17 total visits, for all LTGs   Status  On-going      SLP LONG TERM GOAL #2   Title  pt will report frustration with deficits at 5 or less (10=most frustrating) in two appointments at or after visit #14    Time  7    Period  Weeks    Status  On-going       Plan - 08/03/18 1323    Clinical Impression Statement  Pt presents with lingering cognitive communication deficits including at least memory and attention - further testing was postponed again today due to cont'd pt state that further cognitive testing would not have been benficial due to pt focus on skills and abilities he has lost. Pt again exhibits overt s/s depression regarding his deficits, and SLP still believes neuropsychological evaluation/treatment may be in patient's best interest prior to completing more ST. SLP told pt tihs today and he significantly downplayed any assistance that he thought neuropsych could give him. If pt continues with this affect/demeanor it would be best for pt to show more of a balanced outlook between acceptance of current ability and hope for future ability prior to engaging in more ST/cognitive tx. Discharge next session if pt has shown little or no improvement in outlook/perspective.     Speech Therapy Frequency  2x / week    Duration  --   8 weeks/17 sessions   Treatment/Interventions  Cueing hierarchy;SLP instruction and feedback;Compensatory strategies;Functional tasks;Cognitive reorganization;Internal/external aids;Patient/family education    Potential to Achieve Goals  Fair    Potential Considerations   Co-morbidities;Cooperation/participation level;Other (comment)   presence of s/s not unlike depression      Patient will benefit from skilled therapeutic intervention in order to improve the following deficits and impairments:   Cognitive communication deficit  Problem List Patient Active Problem List   Diagnosis Date Noted  . Cerebral embolism with cerebral infarction 02/01/2018  . 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 Stroke-like symptoms   . Ataxia 01/31/2018  . Elevated blood pressure reading 01/31/2018  . Asthma     Roniya Tetro ,MS, CCC-SLP  08/03/2018, 1:28 PM  [facility] [address] [place], [place], [address] Phone: [phone]   Fax:  [phone]   Name: Wayne Richards MRN: [id number] Date of Birth: Jul 30, 1955

## 2018-08-05 ENCOUNTER — Ambulatory Visit: Payer: 59

## 2018-08-05 DIAGNOSIS — R41841 Cognitive communication deficit: Secondary | ICD-10-CM | POA: Diagnosis not present

## 2018-08-05 NOTE — Patient Instructions (Signed)
I recommend neuropsych to assist you in dealing with your feelings and thoughts of loss of ability/skills.

## 2018-08-05 NOTE — Therapy (Signed)
[facility] [address] [place], [place], [address] Phone: [phone]   Fax:  [phone]  Speech Language Pathology Treatment/Discharge Summary  Patient Details  Name: Wayne Richards MRN: [id number] Date of Birth: [birth year] Referring Provider (SLP): Venancio Poisson, NP   Encounter Date: 08/05/2018  End of Session - 08/05/18 1007    Visit Number  4    Number of Visits  17    Date for SLP Re-Evaluation  10/20/18    SLP Start Time  0848    SLP Stop Time   0930    SLP Time Calculation (min)  42 min       Past Medical History:  Diagnosis Date  . Asthma   . Stroke [facility])     Past Surgical History:  Procedure Laterality Date  . ACHILLES TENDON SURGERY      There were no vitals filed for this visit.  Subjective Assessment - 08/05/18 0853    Subjective  "It's not (going)." (pt, re: Constant Therapy) "They play stump the chump."    Currently in Pain?  Yes    Pain Score  6     Pain Location  Neck    Pain Orientation  Left    Pain Descriptors / Indicators  Burning    Pain Type  Chronic pain    Pain Radiating Towards  elbow    Pain Onset  More than a month ago    Pain Frequency  Constant    Aggravating Factors   nothing     Pain Relieving Factors  nothing            ADULT SLP TREATMENT - 08/05/18 0855      General Information   Behavior/Cognition  Alert;Cooperative;Lethargic      Treatment Provided   Treatment provided  Cognitive-Linquistic      Cognitive-Linquistic Treatment   Treatment focused on  Cognition    Skilled Treatment  Pt expressed dissatisfaction about the tasks in Constant Therapy targeting his visual and auditory attention.  "They were like second grade work." Due to this, SLP focused on functional, practical attention to detail and alternating attention task (basketball season tickets). After first response, pt answered incorrectly and from that point no longer wrote answers to  responses with the pencil. Pt began working on a more difficult task and after two incorrect answers pt complained that the situation in the problem would never occur. SLP told pt that the task is a means to an end (the end being working on skills necessary for improved attention). Pt and SLP engaged in conversation re: pt's deficits and pt admitted to changes not happening quickly enough for him as well as not having an attitude/outlook most helpful for progress in therapy. SLP again told pt he suggests neuropsych. Pt and SLP agreed it would be best for pt to d/c at this time. SLP specifically told pt that the possibility for return to ST was an option at a later date.       Assessment / Recommendations / [place] other service (comment)      Progression Toward Goals   Progression toward goals  Not progressing toward goals (comment)   pt focus on skills and abilities lost      SLP Education - 08/05/18 1007    Education Details  neuropsych recommended    Person(s) Educated  Patient    Methods  Explanation    Comprehension  Verbalized understanding  SLP Short Term Goals - 08/05/18 1009      SLP SHORT TERM GOAL #1   Title  pt will use at least two memory strategies/memory system between 3 sessions to incr success with short term memory    Time  3    Period  Weeks    Status  Not Met      SLP SHORT TERM GOAL #2   Title  pt will report frustration with deficits as 5.5 or less (10=most frustrating) at visit #8    Time  3    Period  Weeks    Status  Not Met      SLP SHORT TERM GOAL #3   Title  pt will complete formal cognitive linguistic testing by session #4    Time  1    Period  --   visits   Status  Not Met       SLP Long Term Goals - 08/05/18 1009      SLP LONG TERM GOAL #1   Title  pt will report using/use memory system between 4 ST sessions    Time  7    Period  Weeks   or 17 total visits, for all LTGs   Status  Not Met      SLP LONG TERM GOAL #2    Title  pt will report frustration with deficits at 5 or less (10=most frustrating) in two appointments at or after visit #14    Time  7    Period  Weeks    Status  Not Met       Plan - 08/05/18 1007    Clinical Impression Statement  Pt presents with lingering cognitive communication deficits including at least memory and attention - see "skilled intervention" for details. SLP and pt agreed best for pt to d/c today. Pt again exhibited overt s/s depression regarding his deficits, and SLP still believes neuropsychological evaluation/treatment may be in patient's best interest prior to completing more ST.     Treatment/Interventions  Cueing hierarchy;SLP instruction and feedback;Compensatory strategies;Functional tasks;Cognitive reorganization;Internal/external aids;Patient/family education    Potential to Achieve Goals  Fair    Potential Considerations  Co-morbidities;Cooperation/participation level;Other (comment)   presence of s/s not unlike depression      Patient will benefit from skilled therapeutic intervention in order to improve the following deficits and impairments:   Cognitive communication deficit   SPEECH THERAPY DISCHARGE SUMMARY  Visits from Start of Care: 4  Current functional level related to goals / functional outcomes: Pt made little progress in goals due to a hyper-focus on skills and abilities he had lost since CVA. Neurospych was recommended to pt, NP was contacted and agreed, and referral was made. SLP told pt that future ST was definitely still a possibility.   Remaining deficits: Cognitive-linguistic deficits including at least attention and memory.   Education / Equipment: Compensations for attention, need for neuropsych.   Plan: Patient agrees to discharge.  Patient goals were not met. Patient is being discharged due to the patient's request.  ?????       Problem List Patient Active Problem List   Diagnosis Date Noted  . Cerebral embolism with  cerebral infarction 02/01/2018  . Stroke (cerebrum) ([place]) 02/01/2018  . Stroke-like symptoms   . Ataxia 01/31/2018  . Elevated blood pressure reading 01/31/2018  . Asthma     Kassem Kibbe ,MS, CCC-SLP  08/05/2018, 10:10 AM  [facility] [address] [place], [place],  [address] Phone: [phone]   Fax:  [phone]   Name: Olie Dibert MRN: [id number] Date of Birth: Aug 21, 1955

## 2018-08-08 ENCOUNTER — Ambulatory Visit: Payer: 59

## 2018-08-08 NOTE — Progress Notes (Signed)
Thank you for this update.  I truly hope he decides to be evaluated by neuropsych.  I also speak to him in regards to this follow-up appointment.

## 2018-08-19 ENCOUNTER — Telehealth: Payer: Self-pay | Admitting: Psychology

## 2018-09-07 ENCOUNTER — Telehealth: Payer: Self-pay | Admitting: Adult Health

## 2018-09-07 NOTE — Telephone Encounter (Signed)
Received fax from [facility] containing lab work obtained on 08/22/2018.  Lipid panel obtained with the following results  Cholesterol: 125 Triglycerides: 63 HDL: 41 Calc LDL: 72 Non--HDL: 84 Chol/HDL ratio: 3.1

## 2018-09-19 ENCOUNTER — Encounter: Payer: Self-pay | Admitting: Speech Pathology

## 2018-10-04 ENCOUNTER — Other Ambulatory Visit: Payer: Self-pay | Admitting: Adult Health

## 2018-11-23 ENCOUNTER — Ambulatory Visit: Payer: 59 | Admitting: Adult Health

## 2018-12-27 ENCOUNTER — Other Ambulatory Visit: Payer: Self-pay | Admitting: Adult Health

## 2018-12-28 ENCOUNTER — Other Ambulatory Visit: Payer: Self-pay

## 2018-12-28 MED ORDER — ROSUVASTATIN CALCIUM 20 MG PO TABS
20.0000 mg | ORAL_TABLET | Freq: Every day | ORAL | 3 refills | Status: DC
Start: 2018-12-28 — End: 2019-05-03

## 2019-01-17 ENCOUNTER — Telehealth: Payer: Self-pay

## 2019-01-17 NOTE — Telephone Encounter (Signed)
Pt returned call and stated he isnt interested in video visit he would rather r/s his appt.

## 2019-01-17 NOTE — Telephone Encounter (Signed)
Pt already r/s for February 17, 2019 .

## 2019-01-17 NOTE — Telephone Encounter (Signed)
Left vm for patient that appt will be a mychart video visit due to COVID 19. It will not be a in office visit. PT is already on mychart for [place]. We need verbal consent to do video visit and to file insurance. Pt can do visit with cell phone any device that has a camera on it.

## 2019-01-17 NOTE — Telephone Encounter (Signed)
Was he already rescheduled for you talk about rescheduling his appointment on 7/24?

## 2019-01-19 ENCOUNTER — Ambulatory Visit: Payer: 59 | Admitting: Adult Health

## 2019-02-15 ENCOUNTER — Other Ambulatory Visit: Payer: Self-pay

## 2019-02-15 ENCOUNTER — Telehealth: Payer: Self-pay | Admitting: *Deleted

## 2019-02-15 ENCOUNTER — Ambulatory Visit (INDEPENDENT_AMBULATORY_CARE_PROVIDER_SITE_OTHER): Payer: Managed Care, Other (non HMO) | Admitting: Adult Health

## 2019-02-15 ENCOUNTER — Encounter: Payer: Self-pay | Admitting: Adult Health

## 2019-02-15 ENCOUNTER — Other Ambulatory Visit: Payer: Self-pay | Admitting: Adult Health

## 2019-02-15 VITALS — BP 140/89 | HR 64 | Temp 97.0°F | Ht 73.0 in | Wt 237.6 lb

## 2019-02-15 DIAGNOSIS — I63513 Cerebral infarction due to unspecified occlusion or stenosis of bilateral middle cerebral arteries: Secondary | ICD-10-CM

## 2019-02-15 DIAGNOSIS — E785 Hyperlipidemia, unspecified: Secondary | ICD-10-CM | POA: Diagnosis not present

## 2019-02-15 DIAGNOSIS — G8114 Spastic hemiplegia affecting left nondominant side: Secondary | ICD-10-CM

## 2019-02-15 DIAGNOSIS — I69319 Unspecified symptoms and signs involving cognitive functions following cerebral infarction: Secondary | ICD-10-CM | POA: Diagnosis not present

## 2019-02-15 DIAGNOSIS — I10 Essential (primary) hypertension: Secondary | ICD-10-CM

## 2019-02-15 MED ORDER — BACLOFEN 5 MG PO TABS: 5.0000 mg | ORAL_TABLET | Freq: Every evening | ORAL | 2 refills | Status: DC | PRN

## 2019-02-15 MED ORDER — TIZANIDINE HCL 2 MG PO CAPS: 2.0000 mg | ORAL_CAPSULE | Freq: Every evening | ORAL | 2 refills | Status: AC | PRN

## 2019-02-15 NOTE — Patient Instructions (Addendum)
Continue aspirin 81 mg daily  and Crestor  for secondary stroke prevention  Continue to follow up with PCP regarding cholesterol and blood pressure management   Start baclofen 5mg  nightly as needed for your spasticity of leg side - if does needs to be increased, please let me know  Continue to stay active and maintain a healthy diet  Follow up with your PCP if you continue to have difficulty sleeping. Melatonin will be okay for you to take  Continue to monitor blood pressure at home  Maintain strict control of hypertension with blood pressure goal below 130/90, diabetes with hemoglobin A1c goal below 6.5% and cholesterol with LDL cholesterol (bad cholesterol) goal below 70 mg/dL. I also advised the patient to eat a healthy diet with plenty of whole grains, cereals, fruits and vegetables, exercise regularly and maintain ideal body weight.  Followup in the future with me in 6 months or call earlier if needed       Thank you for coming to see us at [facility]. I hope we have been able to provide you high quality care today.  You may receive a patient satisfaction survey over the next few weeks. We would appreciate your feedback and comments so that we may continue to improve ourselves and the health of our patients.   Baclofen tablets What is this medicine? BACLOFEN (BAK loe fen) helps relieve spasms and cramping of muscles. It may be used to treat symptoms of multiple sclerosis or spinal cord injury. This medicine may be used for other purposes; ask your health care provider or pharmacist if you have questions. COMMON BRAND NAME(S): ED Baclofen, Lioresal What should I tell my health care provider before I take this medicine? They need to know if you have any of these conditions:  kidney disease  seizures  stroke  an unusual or allergic reaction to baclofen, other medicines, foods, dyes, or preservatives  pregnant or trying to get  pregnant  breast-feeding How should I use this medicine? Take this medicine by mouth. Swallow it with a drink of water. Follow the directions on the prescription label. Do not take more medicine than you are told to take. Talk to your pediatrician regarding the use of this medicine in children. Special care may be needed. Overdosage: If you think you have taken too much of this medicine contact a poison control center or emergency room at once. NOTE: This medicine is only for you. Do not share this medicine with others. What if I miss a dose? If you miss a dose, take it as soon as you can. If it is almost time for your next dose, take only that dose. Do not take double or extra doses. What may interact with this medicine? Do not take this medication with any of the following medicines:  narcotic medicines for cough This medicine may also interact with the following medications:  alcohol  antihistamines for allergy, cough and cold  certain medicines for anxiety or sleep  certain medicines for depression like amitriptyline, fluoxetine, sertraline  certain medicines for seizures like phenobarbital, primidone  general anesthetics like halothane, isoflurane, methoxyflurane, propofol  local anesthetics like lidocaine, pramoxine, tetracaine  medicines that relax muscles for surgery  narcotic medicines for pain  phenothiazines like chlorpromazine, mesoridazine, prochlorperazine, thioridazine This list may not describe all possible interactions. Give your health care provider a list of all the medicines, herbs, non-prescription drugs, or dietary supplements you use. Also tell them if you smoke, drink alcohol, or use  illegal drugs. Some items may interact with your medicine. What should I watch for while using this medicine? Tell your doctor or health care professional if your symptoms do not start to get better or if they get worse. Do not suddenly stop taking your medicine. If you do,  you may develop a severe reaction. If your doctor wants you to stop the medicine, the dose will be slowly lowered over time to avoid any side effects. Follow the advice of your doctor. You may get drowsy or dizzy. Do not drive, use machinery, or do anything that needs mental alertness until you know how this medicine affects you. Do not stand or sit up quickly, especially if you are an older patient. This reduces the risk of dizzy or fainting spells. Alcohol may interfere with the effect of this medicine. Avoid alcoholic drinks. If you are taking another medicine that also causes drowsiness, you may have more side effects. Give your health care provider a list of all medicines you use. Your doctor will tell you how much medicine to take. Do not take more medicine than directed. Call emergency for help if you have problems breathing or unusual sleepiness. What side effects may I notice from receiving this medicine? Side effects that you should report to your doctor or health care professional as soon as possible:  allergic reactions like skin rash, itching or hives, swelling of the face, lips, or tongue  breathing problems  changes in emotions or moods  changes in vision  chest pain  fast, irregular heartbeat  feeling faint or lightheaded, falls  hallucinations  loss of balance or coordination  ringing of the ears  seizures  trouble passing urine or change in the amount of urine  trouble walking  unusually weak or tired Side effects that usually do not require medical attention (report to your doctor or health care professional if they continue or are bothersome):  changes in taste  confusion  constipation  diarrhea  dry mouth  headache  muscle weakness  nausea, vomiting  trouble sleeping This list may not describe all possible side effects. Call your doctor for medical advice about side effects. You may report side effects to FDA at 1-800-FDA-1088. Where should I  keep my medicine? Keep out of the reach of children. Store at room temperature between 15 and 30 degrees C (59 and 86 degrees F). Keep container tightly closed. Throw away any unused medicine after the expiration date. NOTE: This sheet is a summary. It may not cover all possible information. If you have questions about this medicine, talk to your doctor, pharmacist, or health care provider.  2020 Elsevier/Gold Standard (2017-04-24 09:56:42)

## 2019-02-15 NOTE — Telephone Encounter (Signed)
Received fax from [facility] that baclofen 5mg  tablets not covered by insurance.  GoodRx shows that 10mg  tablets (scored) and cost would be $8.85 for 60 days vs $29.38 5mg  tabs for 30 days at [facility].  Can we change.

## 2019-02-15 NOTE — Telephone Encounter (Signed)
I spoke to pt and relayed that JV/NP placed order for tizanidine 2mg  caps and will see if insurance will cover for this.  If not then good rx be an option.  He will call [facility] and then if needed will call me back tomorrow.

## 2019-02-15 NOTE — Progress Notes (Signed)
[facility] [address] [place]. [place] [address] [phone]       OFFICE FOLLOW UP NOTE  Wayne Richards Date of Birth:  [birth year] Medical Record Number:  [id number]   Reason for visit: Stroke follow up  CHIEF COMPLAINT:  Chief Complaint  Patient presents with   Follow-up    6 mon f/u. Alone. Rm 9. Patient mentioned that he is still having pain on his left side in his neck that shoot down to his leg, muscle spasms and insomnia.     HPI:  Stroke admission 01/31/2018: Mr. Wayne Richards is a 63 y.o. male with history of vertigo, HTN and HLD who presented after 1 week of incoordination of L side along with some numbness in left hand.  CT head reviewed and was negative for acute stroke.  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  MRA neck showed patent carotid and vertebral arteries without hemodynamically significant stenosis.  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  2D echo within normal EF and negative for PFO.  LDL 150 and as patient was not on statin PTA is recommended to start Lipitor 80 mg daily.  A1c satisfactory at 5.8.  Patient was not on antithrombotic PTA and recommended DAPT with aspirin and Plavix for 3 weeks and then aspirin alone as it was believed etiology likely from small vessel disease.  Patient was discharged home with recommendations of continued PT/OT.  02/21/2018 visit: Patient is being seen today for hospital follow-up and is accompanied by his wife.  [facility] session due to a nurse from the insurance company advised him that he could have a new brain hemorrhage due to possible increase in left-sided weakness and facial droop but per wife, she did notice the symptoms in the hospital but did not report them to staff due to her believing they are  already aware.  PCP did order CT scan which was completed on 02/10/2018 which was negative for acute infarct or hemorrhage.  Patient does continue to have residual left-sided weakness but states this has been improving.  Patient continues to attend PT/OT at our neuro rehab clinic and has been making daily improvements.  He does state that he notices mild increase in weakness when he is fatigued or with increased stress.  Patient continues to take both aspirin and Plavix without side effects of bleeding or bruising.  Continues to take Lipitor with possible myalgias but patient is able to tolerate at this time and feels as though these pains have been improving.  Blood pressure mildly elevated at 142/87 and his patient does monitor this at home, he states it is typically lower.  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  Patient was educated on post stroke depression and was advised that this is something that can improve over time or this could be a lifelong issue that he may have to be treated for.  Patient would rather hold off at this time on starting any depression medications and continue to monitor.  Long discussion regarding small vessel disease and stroke risk factor modification and management.  Denies new or worsening stroke/TIA symptoms.  history 05/24/2018: Patient is being seen today for follow-up visit.  He continues to have left  hemiparesis and when asked if he has gained any improvement, he states "other people say that I have but I do not see it".  He also continues to have cognitive difficulties such as processing information and short-term memory.  He does continue to take aspirin without side effects of bleeding or bruising.  He currently is taking atorvastatin 80 mg daily 3 times weekly due to possible statin myalgias as recommended by PCP.  He does continue to have intermittent myalgias that  have slightly reduced since decreasing dose but have not resolved.  He is currently taking co-Q10 100 mg 3 times daily which helps some.  He has completed therapies at this time but continues to participate at his local gym with a yoga and strength exercising programs.  Recent lab work obtained by PCP on 04/20/2018 showed total cholesterol 101, HDL 33, triglycerides 82 and LDL 52.  Patient becomes tearful during appointment as he states that he has a difficult time accepting that this is a new him and that he will be unable to play golf and is frustrated with his lack of improvement.  He has only been doing some of the exercises that were recommended during therapy as he does not understand the reasoning behind "putting little pegs in holes".  He continues to decline depression/anxiety since his stroke which was also discussed at prior appointment. He states that typically he is stable but becomes upset when he speaks about his stroke and "lack of progress".  Denies new or worsening stroke/TIA symptoms.  02/15/19 VISIT Wayne Richards is a 63 year old male who is being seen today for follow-up regarding right pontine infarct in 01/2018.  He continues to have residual deficits of left hemiparesis and cognitive impairment. He does endorse some improvement since prior visit and has been stable recently. H morning e does continue compensation strategies to help with his decreased memory and is able to maintain adequate quality of life.  He does endorse occasional pains on left side that radiates from left side of neck down into his arm and at times into leg. Pain consists of burning pain and at time cramping/spasm sensation.  He will typically feel this pain in the morning upon awakening and will improve a few hours after ambulating.  Continues on aspirin 81 mg and rosuvastatin 20 mg without reported side effects for secondary stroke prevention.  Blood pressure 140/89.  He also endorses 2627-month history of insomnia where he  is able to fall asleep but will wake up approximately 2 hours later and will not be able to go back to sleep until around 6 AM but then will sleep until mid afternoon.  He denies mind racing with anxiety/depression.  Room environment adequate for sleep.  He is questioning melatonin use.  No further concerns at this time.     ROS:   14 system review of systems performed and negative with exception of insomnia, memory loss, weakness and pain   PMH:  Past Medical History:  Diagnosis Date   Asthma    Stroke (HCC)     PSH:  Past Surgical History:  Procedure Laterality Date   ACHILLES TENDON SURGERY      Social History:  Social History   Socioeconomic History   Marital status: Married    Spouse name: Not on file   Number of children: Not on file   Years of education: Not on file   Highest education level: Not on file  Occupational History   Not on file  Social  Needs   Financial resource strain: Not on file   Food insecurity    Worry: Not on file    Inability: Not on file   Transportation needs    Medical: Not on file    Non-medical: Not on file  Tobacco Use   Smoking status: Never Smoker   Smokeless tobacco: Never Used  Substance and Sexual Activity   Alcohol use: Yes    Frequency: Never    Comment: SOCIAL   Drug use: Never   Sexual activity: Not on file  Lifestyle   Physical activity    Days per week: Not on file    Minutes per session: Not on file   Stress: Not on file  Relationships   Social connections    Talks on phone: Not on file    Gets together: Not on file    Attends religious service: Not on file    Active member of club or organization: Not on file    Attends meetings of clubs or organizations: Not on file    Relationship status: Not on file   Intimate partner violence    Fear of current or ex partner: Not on file    Emotionally abused: Not on file    Physically abused: Not on file    Forced sexual activity: Not on file    Other Topics Concern   Not on file  Social History Narrative   Not on file    Family History:  Family History  Problem Relation Age of Onset   Hypertension Father    Stroke Father    Cardiomyopathy Brother     Medications:   Current Outpatient Medications on File Prior to Visit  Medication Sig Dispense Refill   aspirin EC 81 MG EC tablet Take 1 tablet (81 mg total) by mouth daily. 30 tablet 1   Coenzyme Q10 (COQ10) 100 MG CAPS Take by mouth 3 (three) times daily.     famotidine (PEPCID) 20 MG tablet Take 1 tablet (20 mg total) by mouth 2 (two) times daily as needed (with Ibuprofen). 60 tablet 0   rosuvastatin (CRESTOR) 20 MG tablet Take 1 tablet (20 mg total) by mouth daily. 30 tablet 3   salmeterol (SEREVENT DISKUS) 50 MCG/DOSE diskus inhaler Inhale 1 puff into the lungs 2 (two) times daily.     No current facility-administered medications on file prior to visit.     Allergies:   Allergies  Allergen Reactions   Iodine Hives, Itching, Swelling and Rash   Peanut-Containing Drug Products Hives, Itching and Swelling   Shellfish Allergy Hives, Itching and Swelling   Penicillins Hives, Swelling and Rash     Physical Exam  Vitals:   02/15/19 0803  BP: 140/89  Pulse: 64  Temp: (!) 97 F (36.1 C)  TempSrc: Oral  Weight: 237 lb 9.6 oz (107.8 kg)  Height: 6\' 1"  (1.854 m)   Body mass index is 31.35 kg/m. No exam data present  General: well developed, well nourished, pleasant middle-aged African-American male, seated, in no evident distress Head: head normocephalic and atraumatic.   Neck: supple with no carotid or supraclavicular bruits Cardiovascular: regular rate and rhythm, no murmurs Musculoskeletal: no deformity Skin:  no rash/petichiae Vascular:  Normal pulses all extremities  Neurologic Exam Mental Status: Awake and fully alert. Oriented to place and time. Recent and remote memory intact. Attention span, concentration and fund of knowledge  appropriate. Mood and affect appropriate but tearful at times during appointment Cranial Nerves: Pupils equal, briskly  reactive to light. Extraocular movements full without nystagmus. Visual fields full to confrontation. Hearing intact. Facial sensation intact. Face, tongue, palate moves normally and symmetrically.  Motor:  4/5 in hip flexor with increased muscle tone/spasticity; and 5/5 LUE with weak grip strength with increased muscle tone/spasticity. right side 5/5 Sensory.: intact to touch , pinprick , position and vibratory sensation.  Coordination: Rapid alternating movements normal on right side. Finger-to-nose and heel-to-shin performed accurately on right side.  Slightly orbits right arm over left.  Decreased finger dexterity on left hand. Gait and Station: Arises from chair without difficulty. Stance is normal. Gait demonstrates slight hemiplegic gait pattern on left lower extremity Reflexes: 2+ left upper and lower extremity ; 1+ right upper and lower extremity. Toes downgoing.       Diagnostic Data (Labs, Imaging, Testing)  CT head without contrast 01/31/2018 IMPRESSION: 1.  No evidence of acute intracranial abnormality. 2. Mild paranasal sinusitis of uncertain chronicity, probably chronic.  MRI brain without contrast 02/01/2018 IMPRESSION: Acute and subacute infarctions affecting the pons. 6 mm probably subacute infarction in the left side of the pons. Larger, more recent infarction in the right side of the pons, with fairly low level diffusion disturbance at this time. No evidence of swelling or hemorrhage.   MR MRA head without contrast MR MRA neck without contrast 02/01/2018 IMPRESSION: MRA neck: Patent carotid and vertebral arteries in the neck. No hemodynamically significant stenosis by NASCET criteria, occlusion, or aneurysm. 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 No additional large vessel occlusion of the intracranial  circulation.    ASSESSMENT: Wayne Richards is a 63 y.o. 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 Vascular risk factors include HTN, HLD and DM.  Residual deficits of mild left hemiparesis and cognitive/memory deficits    PLAN: -Continue aspirin 81 mg daily and Crestor 20 mg daily secondary stroke prevention -F/u with PCP regarding your HLD and HTN management  -Initiate baclofen 5 mg nightly as needed for left-sided spasticity/increased tone.  Advised to call office with potential need of increasing dose -continue to monitor BP at home -Advised to continue to stay active and eat a healthy diet -Maintain strict control of hypertension with blood pressure goal below 130/90, diabetes with hemoglobin A1c goal below 6.5% and cholesterol with LDL cholesterol (bad cholesterol) goal below 70 mg/dL. I also advised the patient to eat a healthy diet with plenty of whole grains, cereals, fruits and vegetables, exercise regularly and maintain ideal body weight.  Follow up in 6 months or call earlier if needed   Greater than 50% of time during this 25 minute visit was spent on counseling,explanation of diagnosis of right pons infarct and subacute left pontine infarct, reviewing risk factor management of HLD and HTN, planning of further management, discussion with patient and family and coordination of care.   George HughJessica Davene Jobin, AGNP-BC  [facility] [address] [place], [place] [address]  Phone [phone] Fax [phone]

## 2019-02-15 NOTE — Progress Notes (Signed)
I agree with the above plan 

## 2019-02-15 NOTE — Telephone Encounter (Signed)
Has baclofen not covered by insurance, order will be placed for tizanidine.  Please advise patient of this change and advised him that if insurance does not cover tizanidine, he does have an option of using GoodRx if interested.

## 2019-02-17 ENCOUNTER — Ambulatory Visit: Payer: 59 | Admitting: Adult Health

## 2019-05-02 ENCOUNTER — Other Ambulatory Visit: Payer: Self-pay | Admitting: Adult Health

## 2019-08-16 ENCOUNTER — Ambulatory Visit: Payer: Managed Care, Other (non HMO) | Admitting: Adult Health

## 2019-08-30 ENCOUNTER — Other Ambulatory Visit: Payer: Self-pay | Admitting: Adult Health

## 2020-05-08 IMAGING — MR MR MRA NECK W/O CM
1 of 3 series · 32 of 48 positions shown · non-contrast
Comparison: 02/01/2018 MRI of the head.

CLINICAL DATA: 61 y/o  M; evaluation of stroke.

EXAM:
MRA NECK WITHOUT CONTRAST
MRA HEAD WITHOUT CONTRAST
TECHNIQUE: Multiplanar and multiecho pulse sequences of the neck were obtained
without intravenous contrast. Angiographic images of the neck were
obtained using MRA technique without and with intravenous contast.;
Angiographic images of the Circle of Willis were obtained using MRA
technique without intravenous contrast.

[Series 13: tof_fl3d_tra_iso · axial · 0.6mm · 0.52mm/px · z∈[-223,-19]mm · 32 of 344 slices shown]
[im 1/344]
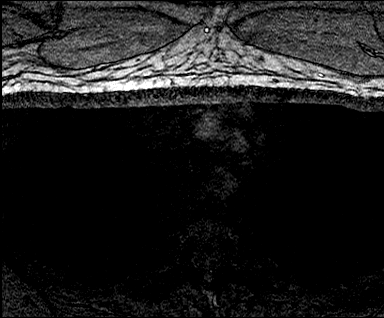
[im 12/344]
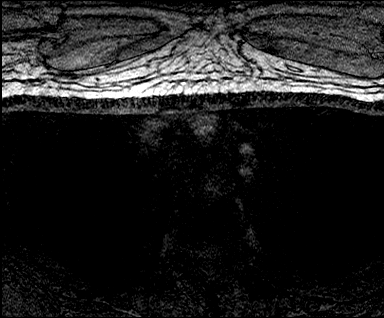
[im 23/344]
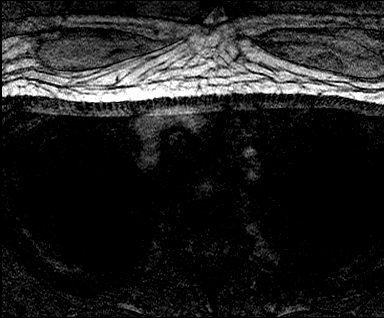
[im 34/344]
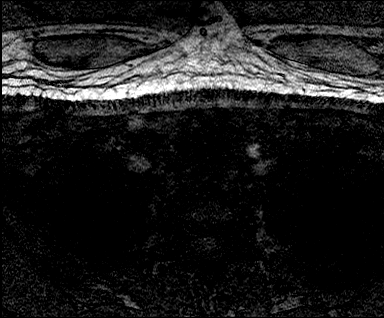
[im 45/344]
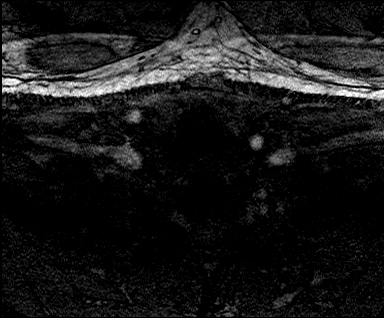
[im 56/344]
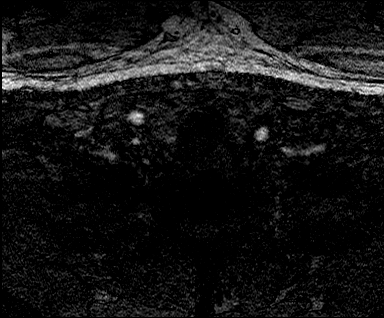
[im 67/344]
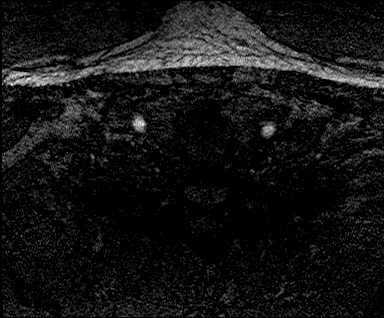
[im 78/344]
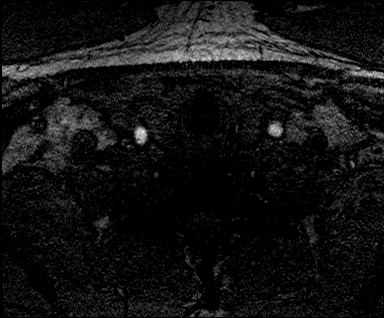
[im 89/344]
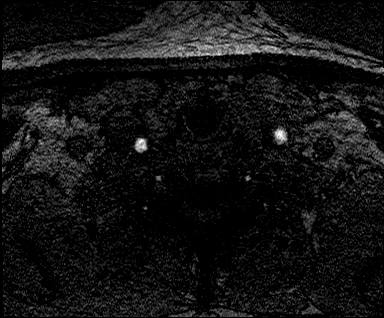
[im 100/344]
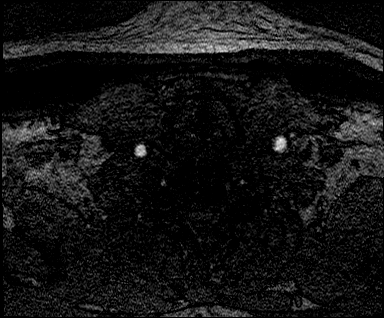
[im 111/344]
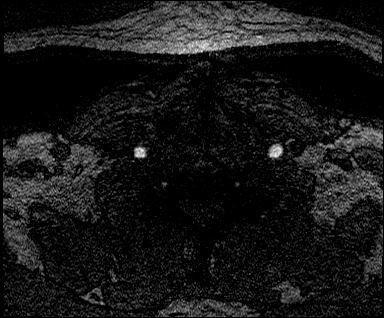
[im 122/344]
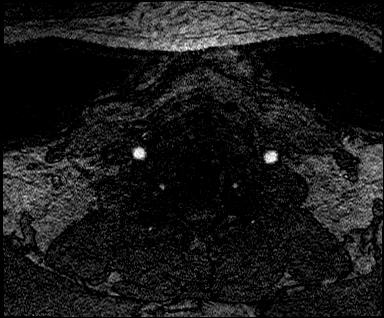
[im 133/344]
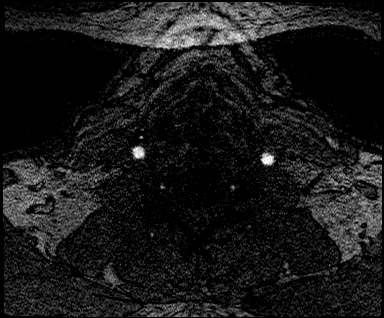
[im 144/344]
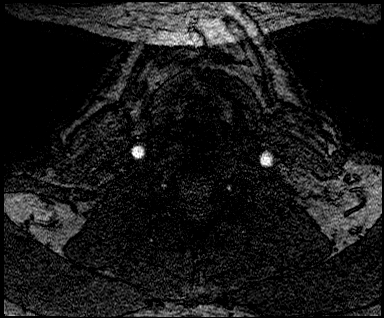
[im 155/344]
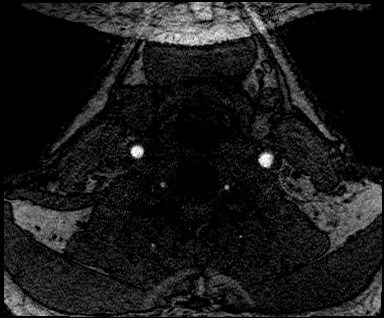
[im 166/344]
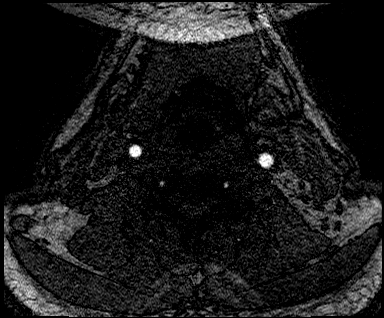
[im 178/344]
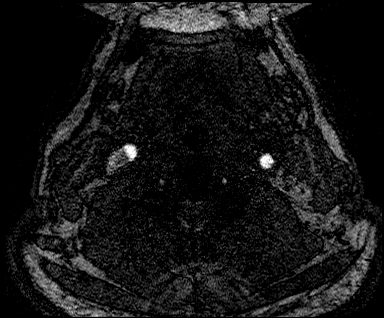
[im 189/344]
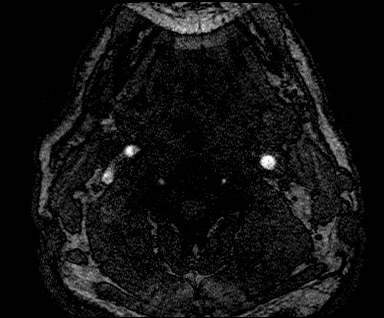
[im 200/344]
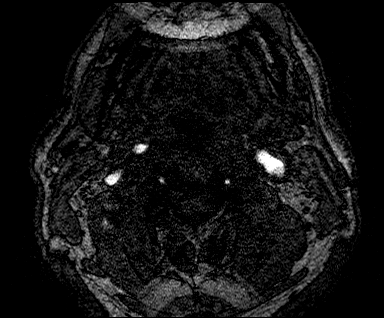
[im 211/344]
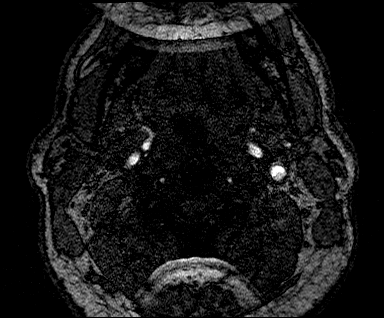
[im 222/344]
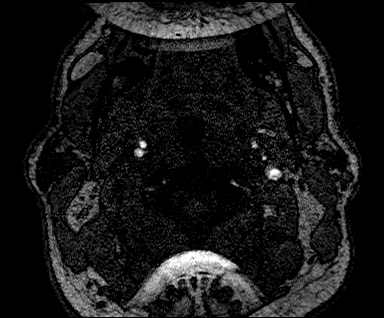
[im 233/344]
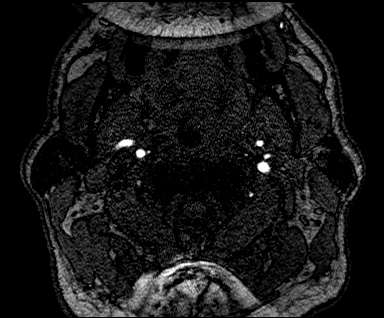
[im 244/344]
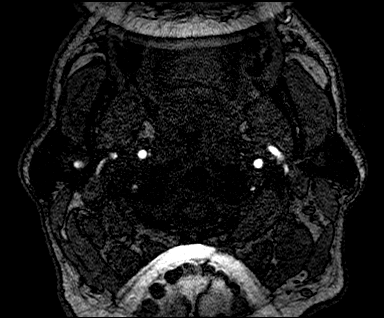
[im 255/344]
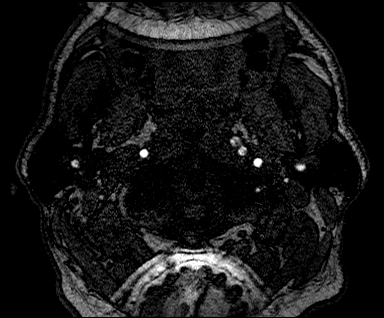
[im 266/344]
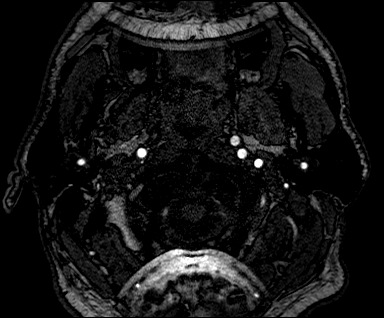
[im 277/344]
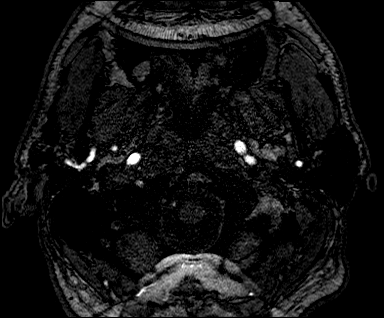
[im 288/344]
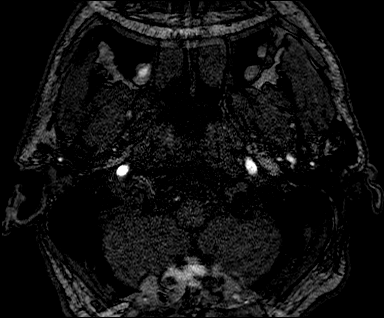
[im 299/344]
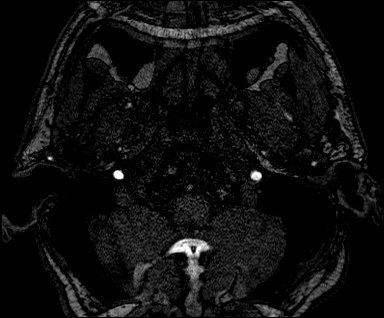
[im 310/344]
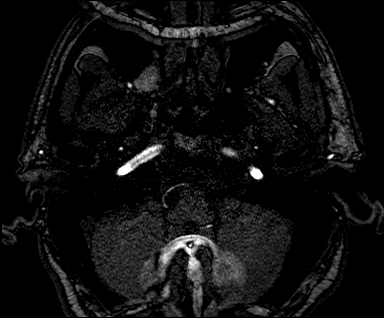
[im 321/344]
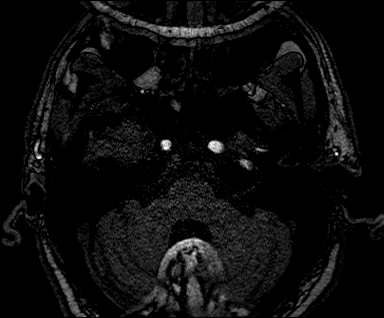
[im 332/344]
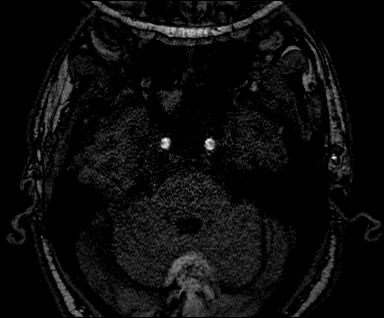
[im 344/344]
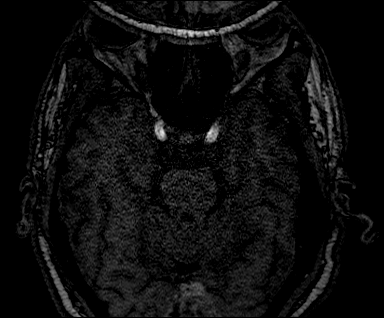

[32 of 48 positions shown; findings below may reference images not displayed]

FINDINGS: MRA NECK FINDINGS

Aortic arch: Patent.

Right common carotid artery: Patent.

Right internal carotid artery: Patent.

Right vertebral artery: Patent.

Left common carotid artery: Patent.

Left Internal carotid artery: Patent.

Left Vertebral artery: Patent.

There is no evidence of hemodynamically significant stenosis by
NASCET criteria, occlusion, or aneurysm unless noted above.

MRA HEAD FINDINGS

Internal carotid arteries:  Patent.

Anterior cerebral arteries:  Patent.

Middle cerebral arteries: Patent.

Anterior communicating artery: Not identified, likely hypoplastic or
absent.

Posterior communicating arteries:  Patent.  Bilateral fetal PCA.

Posterior cerebral arteries:  Patent.

Basilar artery: No flow related signal within the lower and mid
basilar artery excepting a short intervening segment of flow signal.

Vertebral arteries:  Patent.

No additional segment of high-grade stenosis, large vessel
occlusion, or aneurysm.
IMPRESSION: MRA neck:

Patent carotid and vertebral arteries in the neck. No
hemodynamically significant stenosis by NASCET criteria, occlusion,
or aneurysm.

MRA head:

Lower and mid basilar artery segments of occlusion. No additional
large vessel occlusion of the intracranial circulation.

These results will be called to the ordering clinician or
representative by the Radiologist Assistant, and communication
documented in the PACS or zVision Dashboard.

By: Yoel Tiger M.D.

## 2020-05-08 IMAGING — MR MR HEAD W/O CM
10 of 11 series · 42 of 48 positions shown · non-contrast
Comparison: CT yesterday.

CLINICAL DATA: Acute presentation with ataxia and stroke-like
symptoms. Episode of left hand numbness and tingling.

EXAM:
MRI HEAD WITHOUT CONTRAST
TECHNIQUE: Multiplanar, multiecho pulse sequences of the brain and surrounding
structures were obtained without intravenous contrast.

[Series 5: ax dwi_tracew · axial · 3.0mm · 1.50mm/px · z∈[-29,+133]mm · 8 of 80 slices shown]
[im 1/80]
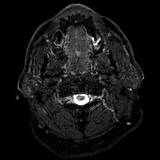
[im 12/80]
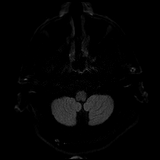
[im 23/80]
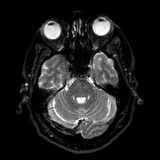
[im 34/80]
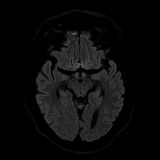
[im 46/80]
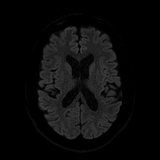
[im 57/80]
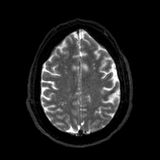
[im 68/80]
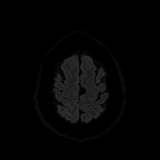
[im 80/80]
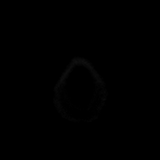

[Series 6: ax dwi_adc · axial · 3.0mm · 1.50mm/px · z∈[-29,+133]mm · 4 of 40 slices shown]
[im 1/40]
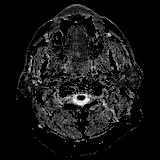
[im 14/40]
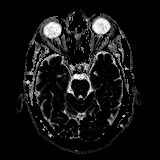
[im 27/40]
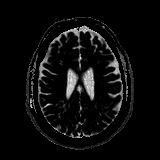
[im 40/40]
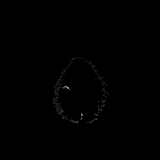

[Series 7: DWI · coronal · 4.0mm · 0.88mm/px · 6 of 72 slices shown (1 of 2)]
[im 1/72]
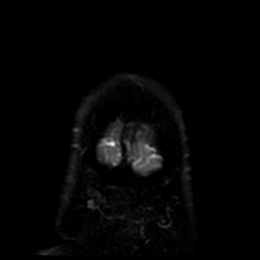
[im 15/72]
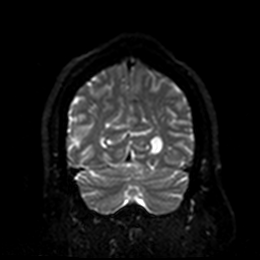
[im 29/72]
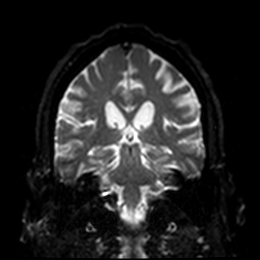
[im 43/72]
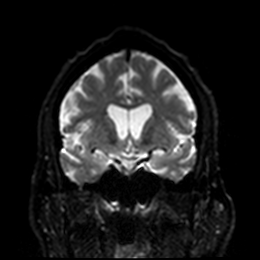
[im 57/72]
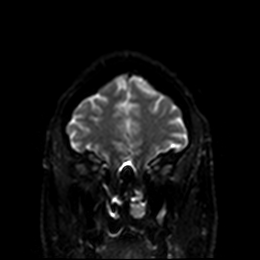
[im 72/72]
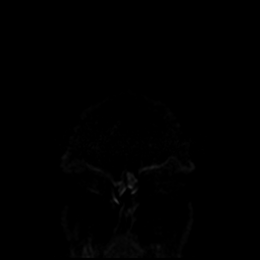

[Series 8: DWI · coronal · 4.0mm · 0.88mm/px · 3 of 36 slices shown (2 of 2)]
[im 1/36]
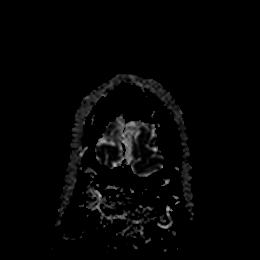
[im 18/36]
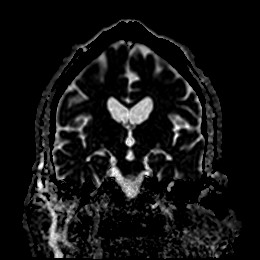
[im 36/36]
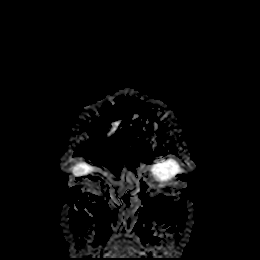

[Series 9: T1 · sagittal · 5.0mm · 0.75mm/px · 2 of 24 slices shown]
[im 1/24]
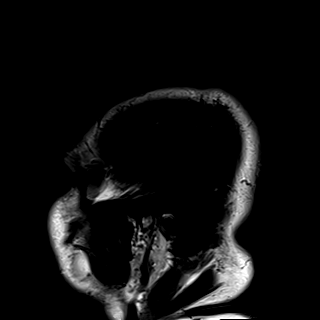
[im 24/24]
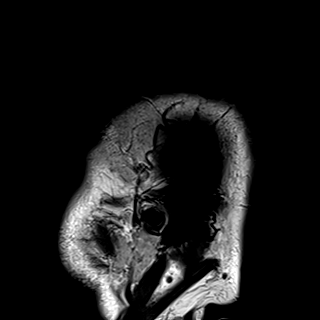

[Series 10: swi_images · axial · 3.0mm · 0.90mm/px · z∈[-46,+164]mm · 6 of 72 slices shown]
[im 1/72]
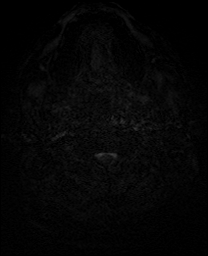
[im 15/72]
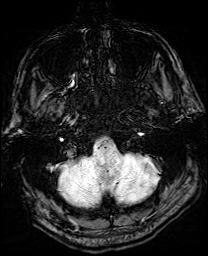
[im 29/72]
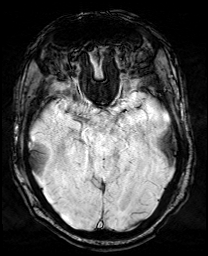
[im 43/72]
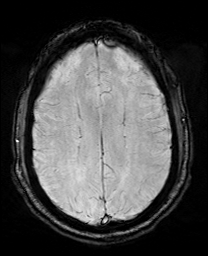
[im 57/72]
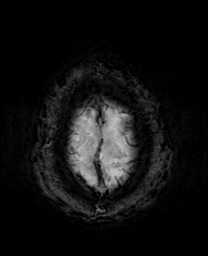
[im 72/72]
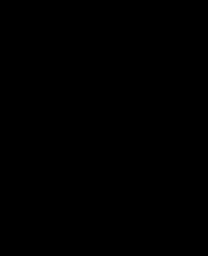

[Series 11: mip_images(sw) · axial · 24.0mm · 0.90mm/px · z∈[-35,+154]mm · 6 of 65 slices shown]
[im 1/65]
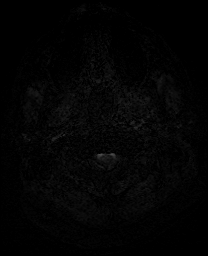
[im 13/65]
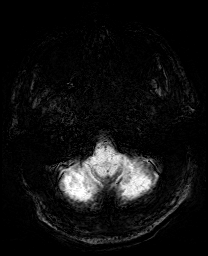
[im 26/65]
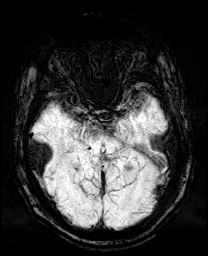
[im 39/65]
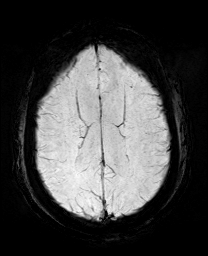
[im 52/65]
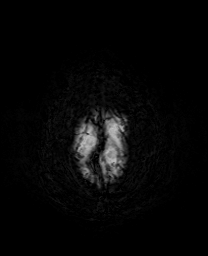
[im 65/65]
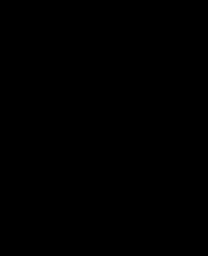

[Series 12: T2 · axial · 5.0mm · 0.72mm/px · z∈[-26,+127]mm · 2 of 27 slices shown (1 of 2)]
[im 1/27]
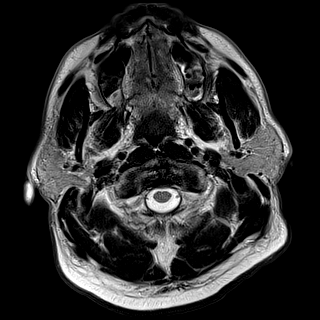
[im 27/27]
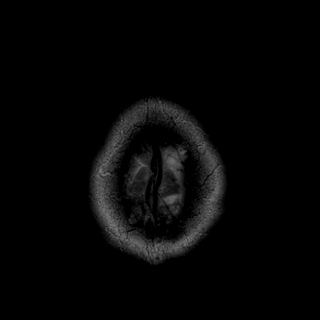

[Series 13: FLAIR · axial · 5.0mm · 0.45mm/px · z∈[-26,+128]mm · 2 of 27 slices shown]
[im 1/27]
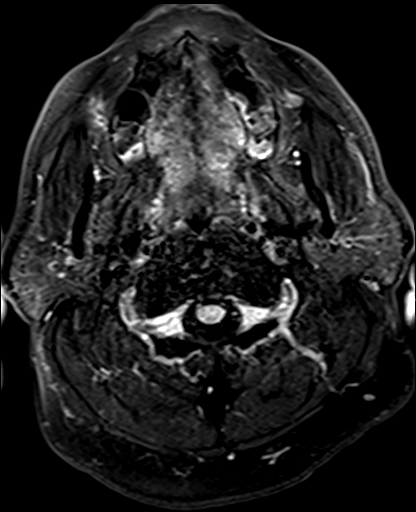
[im 27/27]
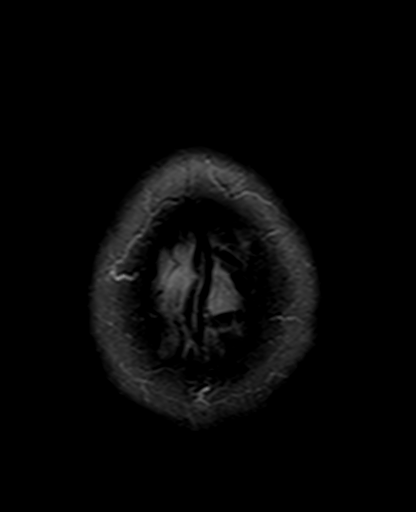

[Series 15: T2 · coronal · 5.0mm · 0.34mm/px · 3 of 31 slices shown (2 of 2)]
[im 1/31]
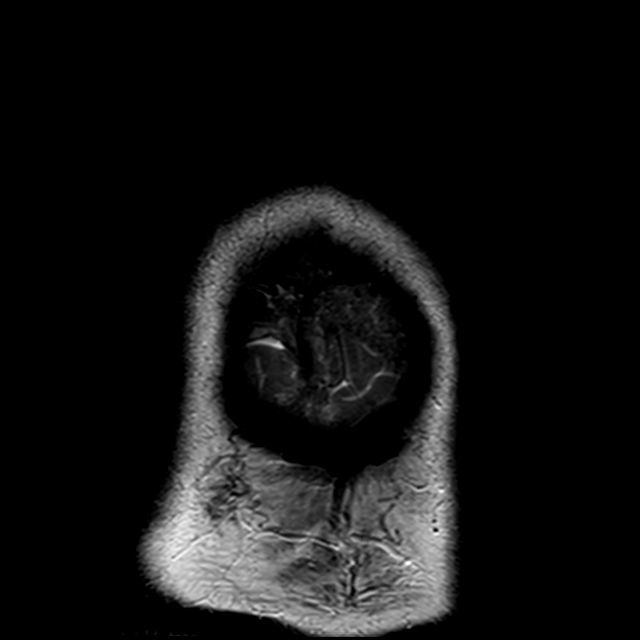
[im 16/31]
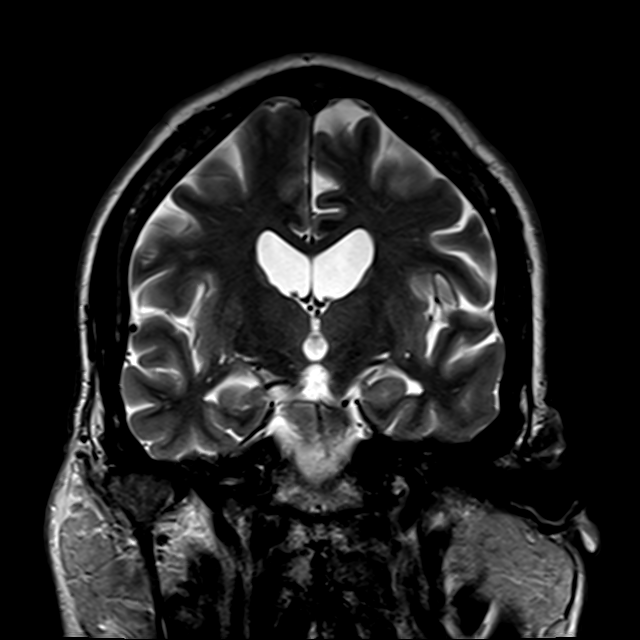
[im 31/31]
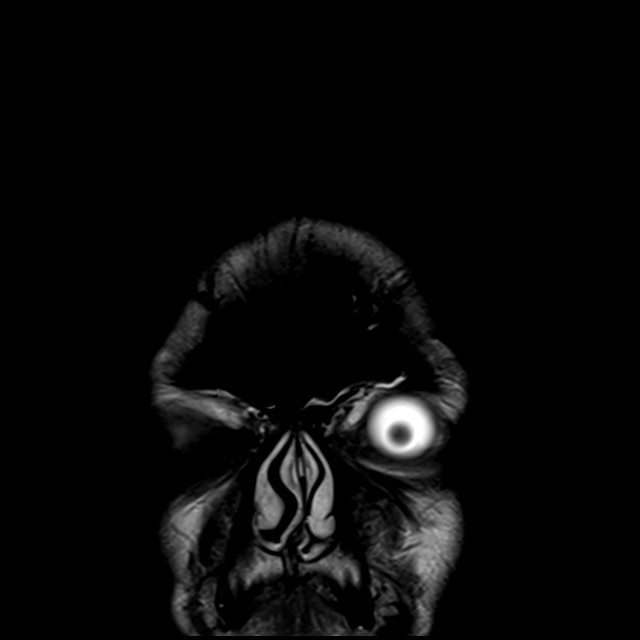

[42 of 48 positions shown; findings below may reference images not displayed]

FINDINGS: Brain: Diffusion imaging shows a 6 mm subacute infarction in the
left side of the pons. There is a larger region likely more acute
infarction in the right side of the pons. No focal cerebellar
insult. Cerebral hemispheres show mild age related volume loss small
vessel or large vessel infarction. There are dilated perivascular
spaces. No sign of hemorrhage, mass, hydrocephalus or extra-axial
collection

Vascular: Anterior circulation vessels appear do show flow.
Posterior circulation vessels are quite diminutive. Due to their
small size, cannot accurately evaluate presence or absence of
vertebrobasilar flow disturbance.

Skull and upper cervical spine: Negative

Sinuses/Orbits: Few small sinus retention cysts. No significant
sinus inflammatory disease. Orbits negative.

Other: None
IMPRESSION: Acute and subacute infarctions affecting the pons. 6 mm probably
subacute infarction in the left side of the pons. Larger, more
recent infarction in the right side of the pons, with fairly low
level diffusion disturbance at this time. No evidence of swelling or
hemorrhage.

Posterior circulation is quite diminutive. I cannot accurately
assess for the potential of flow disturbance in the small posterior
circulation vessels. Consider either CT angiography or MR
angiography for better evaluation.

## 2020-12-21 ENCOUNTER — Encounter (HOSPITAL_COMMUNITY): Payer: Self-pay | Admitting: Emergency Medicine

## 2020-12-21 ENCOUNTER — Other Ambulatory Visit: Payer: Self-pay

## 2020-12-21 ENCOUNTER — Emergency Department (HOSPITAL_COMMUNITY): Payer: Managed Care, Other (non HMO)

## 2020-12-21 ENCOUNTER — Emergency Department (HOSPITAL_COMMUNITY)
Admission: EM | Admit: 2020-12-21 | Discharge: 2020-12-21 | Discharge status: Against Medical Advice | Payer: Managed Care, Other (non HMO) | Attending: Emergency Medicine | Admitting: Emergency Medicine

## 2020-12-21 DIAGNOSIS — Z79899 Other long term (current) drug therapy: Secondary | ICD-10-CM | POA: Diagnosis not present

## 2020-12-21 DIAGNOSIS — Z7982 Long term (current) use of aspirin: Secondary | ICD-10-CM | POA: Diagnosis not present

## 2020-12-21 DIAGNOSIS — I10 Essential (primary) hypertension: Secondary | ICD-10-CM | POA: Diagnosis present

## 2020-12-21 DIAGNOSIS — J45909 Unspecified asthma, uncomplicated: Secondary | ICD-10-CM | POA: Insufficient documentation

## 2020-12-21 DIAGNOSIS — Z91010 Allergy to peanuts: Secondary | ICD-10-CM | POA: Insufficient documentation

## 2020-12-21 DIAGNOSIS — Z7951 Long term (current) use of inhaled steroids: Secondary | ICD-10-CM | POA: Diagnosis not present

## 2020-12-21 LAB — CBC
HCT: 47.2 % (ref 39.0–52.0)
Hemoglobin: 16.3 g/dL (ref 13.0–17.0)
MCH: 32.8 pg (ref 26.0–34.0)
MCHC: 34.5 g/dL (ref 30.0–36.0)
MCV: 95 fL (ref 80.0–100.0)
Platelets: 212 10*3/uL (ref 150–400)
RBC: 4.97 MIL/uL (ref 4.22–5.81)
RDW: 13.6 % (ref 11.5–15.5)
WBC: 10.2 10*3/uL (ref 4.0–10.5)
nRBC: 0 % (ref 0.0–0.2)

## 2020-12-21 LAB — TROPONIN I (HIGH SENSITIVITY)
Troponin I (High Sensitivity): 4 ng/L (ref <18)
Troponin I (High Sensitivity): 5 ng/L (ref <18)

## 2020-12-21 LAB — BASIC METABOLIC PANEL
Anion gap: 9 (ref 5–15)
BUN: 13 mg/dL (ref 8–23)
CO2: 25 mmol/L (ref 22–32)
Calcium: 9.6 mg/dL (ref 8.9–10.3)
Chloride: 104 mmol/L (ref 98–111)
Creatinine, Ser: 1.12 mg/dL (ref 0.61–1.24)
GFR, Estimated: >60 mL/min (ref >60)
Glucose, Bld: 120 mg/dL — ABNORMAL HIGH (ref 70–99)
Potassium: 3.7 mmol/L (ref 3.5–5.1)
Sodium: 138 mmol/L (ref 135–145)

## 2020-12-21 MED ORDER — AMLODIPINE BESYLATE 5 MG PO TABS: 5.0000 mg | ORAL_TABLET | Freq: Every day | ORAL | 0 refills | Status: AC

## 2020-12-21 MED ORDER — LABETALOL HCL 5 MG/ML IV SOLN
20.0000 mg | Freq: Once | INTRAVENOUS | Status: AC
  Administered 2020-12-21: 20 mg via INTRAVENOUS
  Filled 2020-12-21: qty 4

## 2020-12-21 NOTE — ED Triage Notes (Signed)
Pt anxious.  Reports feeling "uneasy" and R sided chest tightness x 1 hour.  Took BP at home and it was 210/145.  Denies numbness/weakness.

## 2020-12-21 NOTE — ED Notes (Signed)
Received verbal report from Janis B. RN 

## 2020-12-21 NOTE — ED Provider Notes (Signed)
[facility] EMERGENCY DEPARTMENT Provider Note   CSN: [id number] Arrival date & time: 12/21/20  1706     History Chief Complaint  Patient presents with  . Chest Pain    Wayne Richards is a 65 y.o. male.  HPI 65 year old male with history of stroke and asthma presents the emergency department for high blood pressure.  States about an hour prior to presentation, was watching a movie with his wife when he felt a feeling of unwellness.  Could not identify what was wrong.  Took his blood pressure and found it to be 220/160.  Took it on the other arm and it was the same.  Double checked that his wife's blood pressure did not seem falsely elevated and his wife's blood pressure was normal.  After he checked his blood pressure felt some chest tightness that has been constant unchanged since onset.  Does not move anywhere, is in the middle of his chest.  Nothing makes it better, nothing makes it worse.  Denies history of anything like this previously.  States that he has had a stroke, but has no stroke symptoms at this time.    Past Medical History:  Diagnosis Date  . Asthma   . Stroke [facility])     Patient Active Problem List   Diagnosis Date Noted  . Cerebral embolism with cerebral infarction 02/01/2018  . 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 Stroke-like symptoms   . Ataxia 01/31/2018  . Elevated blood pressure reading 01/31/2018  . Asthma     Past Surgical History:  Procedure Laterality Date  . ACHILLES TENDON SURGERY         Family History  Problem Relation Age of Onset  . Hypertension Father   . Stroke Father   . Cardiomyopathy Brother     Social History   Tobacco Use  . Smoking status: Never Smoker  . Smokeless tobacco: Never Used  Vaping Use  . Vaping Use: Never used  Substance Use Topics  . Alcohol use: Yes    Comment: SOCIAL  . Drug use: Never    Home Medications Prior to Admission medications   Medication Sig Start Date End Date Taking?  Authorizing Provider  amLODipine (NORVASC) 5 MG tablet Take 1 tablet (5 mg total) by mouth daily. 12/21/20 01/20/21 Yes Louretta Parma, DO  aspirin EC 81 MG EC tablet Take 1 tablet (81 mg total) by mouth daily. 02/02/18   Richarda Overlie, MD  Coenzyme Q10 (COQ10) 100 MG CAPS Take by mouth 3 (three) times daily.    [provider]  famotidine (PEPCID) 20 MG tablet Take 1 tablet (20 mg total) by mouth 2 (two) times daily as needed (with Ibuprofen). 02/02/18   Richarda Overlie, MD  rosuvastatin (CRESTOR) 20 MG tablet TAKE ONE TABLET BY MOUTH ONE TIME DAILY  05/03/19   Ihor Austin, NP  salmeterol (SEREVENT DISKUS) 50 MCG/DOSE diskus inhaler Inhale 1 puff into the lungs 2 (two) times daily.    [provider]  tizanidine (ZANAFLEX) 2 MG capsule Take 1 capsule (2 mg total) by mouth at bedtime as needed for muscle spasms. 02/15/19   Ihor Austin, NP    Allergies    Iodine, Peanut-containing drug products, Shellfish allergy, and Penicillins  Review of Systems   Review of Systems  Constitutional: Negative for chills and fever.  HENT: Negative for ear pain and sore throat.   Eyes: Negative for pain and visual disturbance.  Respiratory: Negative for cough and shortness of breath.  Cardiovascular: Positive for chest pain. Negative for palpitations.  Gastrointestinal: Negative for abdominal pain and vomiting.  Genitourinary: Negative for dysuria and hematuria.  Musculoskeletal: Negative for arthralgias and back pain.  Skin: Negative for color change and rash.  Neurological: Negative for seizures and syncope.  Psychiatric/Behavioral: The patient is nervous/anxious.   All other systems reviewed and are negative.    Physical Exam Updated Vital Signs BP (!) 197/108   Pulse 81   Temp 99.1 F (37.3 C) (Oral)   Resp 18   Ht 6\' 1"  (1.854 m)   Wt 108.9 kg   SpO2 96%   BMI 31.66 kg/m   Physical Exam Vitals and nursing note reviewed.  Constitutional:      General: He is not in  acute distress.    Appearance: He is well-developed.  HENT:     Head: Normocephalic and atraumatic.  Eyes:     Extraocular Movements: Extraocular movements intact.     Conjunctiva/sclera: Conjunctivae normal.  Cardiovascular:     Rate and Rhythm: Normal rate and regular rhythm.     Pulses:          Radial pulses are 2+ on the right side and 2+ on the left side.       Dorsalis pedis pulses are 2+ on the right side and 2+ on the left side.     Heart sounds: Normal heart sounds. No murmur heard.   Pulmonary:     Effort: Pulmonary effort is normal. No respiratory distress.     Breath sounds: Normal breath sounds.  Chest:     Chest wall: No tenderness.  Abdominal:     Palpations: Abdomen is soft.     Tenderness: There is no abdominal tenderness. There is no guarding or rebound.  Musculoskeletal:     Cervical back: Normal range of motion and neck supple.     Right lower leg: No tenderness. No edema.     Left lower leg: No tenderness. No edema.  Skin:    General: Skin is warm and dry.     Capillary Refill: Capillary refill takes less than 2 seconds.  Neurological:     General: No focal deficit present.     Mental Status: He is alert and oriented to person, place, and time.  Psychiatric:        Mood and Affect: Mood is anxious.      ED Results / Procedures / Treatments   Labs (all labs ordered are listed, but only abnormal results are displayed) Labs Reviewed  BASIC METABOLIC PANEL - Abnormal; Notable for the following components:      Result Value   Glucose, Bld 120 (*)    All other components within normal limits  CBC  TROPONIN I (HIGH SENSITIVITY)  TROPONIN I (HIGH SENSITIVITY)    EKG None  Radiology DG Chest 2 View  Result Date: 12/21/2020 CLINICAL DATA:  Chest pain EXAM: CHEST - 2 VIEW COMPARISON:  None. FINDINGS: Mildly enlarged cardiomediastinal silhouette. Tortuous thoracic aorta. LEFT linear opacity most consistent with atelectasis. No pleural effusion or  pneumothorax. Degenerative changes of the thoracic spine. Visualized abdomen is unremarkable. IMPRESSION: 1. Mildly enlarged cardiomediastinal silhouette with a tortuous thoracic aorta. 2. LEFT linear opacities most consistent with atelectasis. Electronically Signed   By: 12/23/2020 MD   On: 12/21/2020 17:50    Procedures Procedures   Medications Ordered in ED Medications  labetalol (NORMODYNE) injection 20 mg (20 mg Intravenous Given 12/21/20 1907)    ED Course  I have reviewed the triage vital signs and the nursing notes.  Pertinent labs & imaging results that were available during my care of the patient were reviewed by me and considered in my medical decision making (see chart for details).    MDM Rules/Calculators/A&P                         65 year old male presents emergency department for feeling of unwellness, chest pain, and hypertension.  Upon arrival, patient is significantly hypertensive.  Does appear very anxious, not in acute distress.  Exam shows reassuring cardiovascular exam.  Pulses are equal bilaterally.  Heart is regular rate and rhythm.  Lungs are clear to auscultation bilaterally.  Abdomen is soft and nontender.  Differential includes: Idiopathic hypertension, renal artery stenosis, dissection, ACS, pericarditis, pneumothorax  Labs with troponin negative x2.  No significant abnormality on CBC.  Kidney function unremarkable.  Chest x-ray shows tortuous aorta with moderately widened mediastinum, no focal pneumonia.  No pneumothorax.  IV labetalol given for hypertension with some improvement.  I discussed findings with patient, recommended CT study for dissection.  He states he is allergic to iodine, though has never had IV iodine before.  States he was allergic to topical iodine.  I then recommended an MRI with contrast to further evaluate for possible dissection.  Overall suspicion for dissection is moderately low, patient just describes vague chest  discomfort that is resolved.  No sudden onset tearing chest pain, does have equal pulses and no focal neurologic deficits.  Had a very extensive discussion with patient regarding risk and benefits of an MRI versus other imaging modalities for dissection.  He states no matter what it showed he would not want cardiothoracic surgery or any intervention done at this time.  States he has lived 64 years and is happy with that, does not want any invasive measures.  He is alert and oriented.  Able to have a rational, goal based decision making discussion.  Wife at bedside has no objection to this as well.  He is able to voice to me that the risks of this decision do involve death or severe disability.  He states he is okay with this risk.  Patient discharged AGAINST MEDICAL ADVICE.  We did discuss symptomatic management and return precautions.  I recommended very close PCP follow-up.  Did prescribe amlodipine for hypertension.   Final Clinical Impression(s) / ED Diagnoses Final diagnoses:  Hypertension, unspecified type    Rx / DC Orders ED Discharge Orders         Ordered    amLODipine (NORVASC) 5 MG tablet  Daily        12/21/20 2113           Louretta Parma, DO 12/21/20 2305    Sabino Donovan, MD 12/21/20 [phone]

## 2020-12-21 NOTE — Discharge Instructions (Addendum)
Start taking amlodipine daily. Follow-up closely with your normal doctor. Come back to the emergency department for worsening chest pain, shortness of breath, or any other condition you feel might be a medical emergency.

## 2020-12-21 NOTE — ED Notes (Signed)
Was advised that pt wanted to leave AMA at bedside to speak with the pt and family per pt there was a surgery discussed and he doesn't want to get the surgery

## 2022-07-28 ENCOUNTER — Emergency Department (HOSPITAL_COMMUNITY): Payer: 59

## 2022-07-28 ENCOUNTER — Emergency Department (HOSPITAL_COMMUNITY)
Admission: EM | Admit: 2022-07-28 | Discharge: 2022-07-28 | Discharge status: Against Medical Advice | Payer: 59 | Attending: Emergency Medicine | Admitting: Emergency Medicine

## 2022-07-28 DIAGNOSIS — Z5321 Procedure and treatment not carried out due to patient leaving prior to being seen by health care provider: Secondary | ICD-10-CM | POA: Insufficient documentation

## 2022-07-28 DIAGNOSIS — R61 Generalized hyperhidrosis: Secondary | ICD-10-CM | POA: Diagnosis not present

## 2022-07-28 DIAGNOSIS — R079 Chest pain, unspecified: Secondary | ICD-10-CM | POA: Insufficient documentation

## 2022-07-28 DIAGNOSIS — R0602 Shortness of breath: Secondary | ICD-10-CM | POA: Diagnosis not present

## 2022-07-28 LAB — URINALYSIS, ROUTINE W REFLEX MICROSCOPIC
Bilirubin Urine: NEGATIVE
Glucose, UA: NEGATIVE mg/dL
Hgb urine dipstick: NEGATIVE
Ketones, ur: NEGATIVE mg/dL
Leukocytes,Ua: NEGATIVE
Nitrite: NEGATIVE
Protein, ur: NEGATIVE mg/dL
Specific Gravity, Urine: 1.006 (ref 1.005–1.030)
pH: 5 (ref 5.0–8.0)

## 2022-07-28 LAB — TROPONIN I (HIGH SENSITIVITY): Troponin I (High Sensitivity): 4 ng/L (ref <18)

## 2022-07-28 MED ORDER — ASPIRIN 81 MG PO CHEW
324.0000 mg | CHEWABLE_TABLET | Freq: Once | ORAL | Status: AC
  Administered 2022-07-28: 243 mg via ORAL
  Filled 2022-07-28: qty 4

## 2022-07-28 NOTE — ED Triage Notes (Signed)
Patient here with complaint of left sided chest pain that started at 0500 this morning, pain is described at a dull stabbing that waxes and wanes. Patient states pain was made worse by laying down and improves with sitting straight up. Patient is alert, oriented, speaking in complete sentences at this time.

## 2022-07-28 NOTE — ED Notes (Signed)
Pt stated he was leaving and will follow up with his PCP

## 2022-07-28 NOTE — ED Provider Triage Note (Signed)
Emergency Medicine Provider Triage Evaluation Note  Wayne Richards , a 67 y.o. male  was evaluated in triage.  Pt complains of left-sided chest pain that woke him from sleep at 5 AM.  Worse with lying flat, better with palpation leaning forward.  Associate with shortness of breath and sweats.  No recent fever or infectious symptoms.  History of stroke with chronic pain to the his left side but this is  unusual.  No known CAD.Marland Kitchen  Review of Systems  Positive: Chest pain, shortness of breath Negative: Fever, vomiting  Physical Exam  BP (!) 177/94 (BP Location: Right Arm)   Pulse 75   Temp 98.7 F (37.1 C) (Oral)   Resp 16   SpO2 98%  Gen:   Awake, no distress   Resp:  Normal effort  MSK:   Moves extremities without difficulty  Other:  Chest pain better with palpation  Medical Decision Making  Medically screening exam initiated at 8:23 AM.  Appropriate orders placed.  Wayne Richards was informed that the remainder of the evaluation will be completed by another provider, this initial triage assessment does not replace that evaluation, and the importance of remaining in the ED until their evaluation is complete.  Atypical for ACS but will rule out   Ezequiel Essex, MD 07/28/22 [phone]
